# Patient Record
Sex: Female | Born: 1960 | Race: White | Hispanic: No | Marital: Single | Smoking: Current every day smoker
Health system: Southern US, Community
[De-identification: ages and names within clinical notes are randomized; demographics above are authoritative.]

## PROBLEM LIST (undated history)

## (undated) DIAGNOSIS — G4730 Sleep apnea, unspecified: Secondary | ICD-10-CM

## (undated) DIAGNOSIS — E119 Type 2 diabetes mellitus without complications: Secondary | ICD-10-CM

## (undated) DIAGNOSIS — K219 Gastro-esophageal reflux disease without esophagitis: Secondary | ICD-10-CM

## (undated) DIAGNOSIS — Z9889 Other specified postprocedural states: Secondary | ICD-10-CM

## (undated) DIAGNOSIS — I8290 Acute embolism and thrombosis of unspecified vein: Secondary | ICD-10-CM

## (undated) DIAGNOSIS — F32A Depression, unspecified: Secondary | ICD-10-CM

## (undated) DIAGNOSIS — M5430 Sciatica, unspecified side: Secondary | ICD-10-CM

## (undated) DIAGNOSIS — I10 Essential (primary) hypertension: Secondary | ICD-10-CM

## (undated) DIAGNOSIS — R112 Nausea with vomiting, unspecified: Secondary | ICD-10-CM

## (undated) DIAGNOSIS — F329 Major depressive disorder, single episode, unspecified: Secondary | ICD-10-CM

## (undated) HISTORY — PX: ROTATOR CUFF REPAIR: SHX139

---

## 1992-10-24 DIAGNOSIS — I8290 Acute embolism and thrombosis of unspecified vein: Secondary | ICD-10-CM

## 1992-10-24 HISTORY — DX: Acute embolism and thrombosis of unspecified vein: I82.90

## 1998-04-24 ENCOUNTER — Encounter: Admission: RE | Admit: 1998-04-24 | Discharge: 1998-07-23 | Payer: Self-pay | Admitting: Endocrinology

## 1998-05-04 ENCOUNTER — Ambulatory Visit (HOSPITAL_BASED_OUTPATIENT_CLINIC_OR_DEPARTMENT_OTHER): Admission: RE | Admit: 1998-05-04 | Discharge: 1998-05-04 | Payer: Self-pay | Admitting: Orthopedic Surgery

## 1998-10-19 ENCOUNTER — Emergency Department (HOSPITAL_COMMUNITY): Admission: EM | Admit: 1998-10-19 | Discharge: 1998-10-20 | Payer: Self-pay | Admitting: Emergency Medicine

## 1998-11-03 ENCOUNTER — Encounter: Admission: RE | Admit: 1998-11-03 | Discharge: 1999-02-01 | Payer: Self-pay | Admitting: Endocrinology

## 1998-12-14 ENCOUNTER — Emergency Department (HOSPITAL_COMMUNITY): Admission: EM | Admit: 1998-12-14 | Discharge: 1998-12-14 | Payer: Self-pay | Admitting: Emergency Medicine

## 1999-01-05 ENCOUNTER — Emergency Department (HOSPITAL_COMMUNITY): Admission: EM | Admit: 1999-01-05 | Discharge: 1999-01-05 | Payer: Self-pay | Admitting: Emergency Medicine

## 1999-02-04 ENCOUNTER — Encounter: Admission: RE | Admit: 1999-02-04 | Discharge: 1999-05-05 | Payer: Self-pay | Admitting: Endocrinology

## 1999-05-10 ENCOUNTER — Encounter: Admission: RE | Admit: 1999-05-10 | Discharge: 1999-08-08 | Payer: Self-pay | Admitting: Endocrinology

## 2001-02-05 ENCOUNTER — Inpatient Hospital Stay (HOSPITAL_COMMUNITY): Admission: EM | Admit: 2001-02-05 | Discharge: 2001-02-10 | Payer: Self-pay | Admitting: Endocrinology

## 2001-05-29 ENCOUNTER — Emergency Department (HOSPITAL_COMMUNITY): Admission: EM | Admit: 2001-05-29 | Discharge: 2001-05-29 | Payer: Self-pay | Admitting: *Deleted

## 2002-04-17 ENCOUNTER — Ambulatory Visit (HOSPITAL_BASED_OUTPATIENT_CLINIC_OR_DEPARTMENT_OTHER): Admission: RE | Admit: 2002-04-17 | Discharge: 2002-04-17 | Payer: Self-pay | Admitting: Orthopedic Surgery

## 2003-09-20 ENCOUNTER — Emergency Department (HOSPITAL_COMMUNITY): Admission: EM | Admit: 2003-09-20 | Discharge: 2003-09-20 | Payer: Self-pay | Admitting: Emergency Medicine

## 2004-12-04 ENCOUNTER — Emergency Department (HOSPITAL_COMMUNITY): Admission: EM | Admit: 2004-12-04 | Discharge: 2004-12-04 | Payer: Self-pay | Admitting: *Deleted

## 2006-07-07 ENCOUNTER — Emergency Department (HOSPITAL_COMMUNITY): Admission: EM | Admit: 2006-07-07 | Discharge: 2006-07-07 | Payer: Self-pay | Admitting: Emergency Medicine

## 2007-04-09 ENCOUNTER — Ambulatory Visit: Payer: Self-pay | Admitting: *Deleted

## 2007-04-09 ENCOUNTER — Ambulatory Visit: Payer: Self-pay | Admitting: Family Medicine

## 2007-04-12 ENCOUNTER — Ambulatory Visit (HOSPITAL_COMMUNITY): Admission: RE | Admit: 2007-04-12 | Discharge: 2007-04-12 | Payer: Self-pay | Admitting: Obstetrics and Gynecology

## 2007-04-16 ENCOUNTER — Encounter: Admission: RE | Admit: 2007-04-16 | Discharge: 2007-04-16 | Payer: Self-pay | Admitting: Family Medicine

## 2007-05-21 ENCOUNTER — Ambulatory Visit: Payer: Self-pay | Admitting: Family Medicine

## 2007-05-21 LAB — CONVERTED CEMR LAB
ALT: 8 units/L (ref 0–35)
AST: 11 units/L (ref 0–37)
Albumin: 4.4 g/dL (ref 3.5–5.2)
Alkaline Phosphatase: 105 units/L (ref 39–117)
BUN: 27 mg/dL — ABNORMAL HIGH (ref 6–23)
Basophils Absolute: 0.1 10*3/uL (ref 0.0–0.1)
Basophils Relative: 1 % (ref 0–1)
CO2: 22 meq/L (ref 19–32)
Calcium: 9.4 mg/dL (ref 8.4–10.5)
Chloride: 102 meq/L (ref 96–112)
Cholesterol: 212 mg/dL — ABNORMAL HIGH (ref 0–200)
Creatinine, Ser: 0.97 mg/dL (ref 0.40–1.20)
Eosinophils Absolute: 0.2 10*3/uL (ref 0.0–0.7)
Eosinophils Relative: 3 % (ref 0–5)
Glucose, Bld: 268 mg/dL — ABNORMAL HIGH (ref 70–99)
HCT: 41.1 % (ref 36.0–46.0)
HDL: 57 mg/dL (ref >39)
Hemoglobin: 13.2 g/dL (ref 12.0–15.0)
LDL Cholesterol: 138 mg/dL — ABNORMAL HIGH (ref 0–99)
Lymphocytes Relative: 28 % (ref 12–46)
Lymphs Abs: 2.4 10*3/uL (ref 0.7–3.3)
MCHC: 32.1 g/dL (ref 30.0–36.0)
MCV: 90.9 fL (ref 78.0–100.0)
Monocytes Absolute: 0.4 10*3/uL (ref 0.2–0.7)
Monocytes Relative: 5 % (ref 3–11)
Neutro Abs: 5.5 10*3/uL (ref 1.7–7.7)
Neutrophils Relative %: 64 % (ref 43–77)
Platelets: 285 10*3/uL (ref 150–400)
Potassium: 4.8 meq/L (ref 3.5–5.3)
RBC: 4.52 M/uL (ref 3.87–5.11)
RDW: 14.6 % — ABNORMAL HIGH (ref 11.5–14.0)
Sodium: 136 meq/L (ref 135–145)
TSH: 3.44 microintl units/mL (ref 0.350–5.50)
Total Bilirubin: 0.3 mg/dL (ref 0.3–1.2)
Total CHOL/HDL Ratio: 3.7
Total Protein: 7.3 g/dL (ref 6.0–8.3)
Triglycerides: 87 mg/dL (ref <150)
VLDL: 17 mg/dL (ref 0–40)
WBC: 8.6 10*3/uL (ref 4.0–10.5)

## 2007-06-26 ENCOUNTER — Ambulatory Visit: Payer: Self-pay | Admitting: Family Medicine

## 2007-08-13 ENCOUNTER — Ambulatory Visit: Payer: Self-pay | Admitting: Family Medicine

## 2007-08-13 LAB — CONVERTED CEMR LAB
Cholesterol: 240 mg/dL — ABNORMAL HIGH (ref 0–200)
HDL: 51 mg/dL (ref >39)
LDL Cholesterol: 162 mg/dL — ABNORMAL HIGH (ref 0–99)
Total CHOL/HDL Ratio: 4.7
Triglycerides: 134 mg/dL (ref <150)
VLDL: 27 mg/dL (ref 0–40)

## 2007-09-24 ENCOUNTER — Encounter: Admission: RE | Admit: 2007-09-24 | Discharge: 2007-09-24 | Payer: Self-pay | Admitting: Family Medicine

## 2007-10-30 ENCOUNTER — Ambulatory Visit: Payer: Self-pay | Admitting: Family Medicine

## 2007-10-30 LAB — CONVERTED CEMR LAB
ALT: 8 units/L (ref 0–35)
Cholesterol: 177 mg/dL (ref 0–200)
HDL: 47 mg/dL (ref >39)
LDL Cholesterol: 101 mg/dL — ABNORMAL HIGH (ref 0–99)
Microalb, Ur: 0.4 mg/dL (ref 0.00–1.89)
Total CHOL/HDL Ratio: 3.8
Triglycerides: 146 mg/dL (ref <150)
VLDL: 29 mg/dL (ref 0–40)

## 2008-02-29 ENCOUNTER — Ambulatory Visit: Payer: Self-pay | Admitting: Internal Medicine

## 2008-03-18 ENCOUNTER — Ambulatory Visit: Payer: Self-pay | Admitting: Family Medicine

## 2008-03-18 ENCOUNTER — Encounter (INDEPENDENT_AMBULATORY_CARE_PROVIDER_SITE_OTHER): Payer: Self-pay | Admitting: Family Medicine

## 2008-04-15 ENCOUNTER — Encounter: Admission: RE | Admit: 2008-04-15 | Discharge: 2008-04-15 | Payer: Self-pay | Admitting: Family Medicine

## 2008-04-15 ENCOUNTER — Ambulatory Visit: Payer: Self-pay | Admitting: Family Medicine

## 2008-04-15 LAB — CONVERTED CEMR LAB
BUN: 26 mg/dL — ABNORMAL HIGH (ref 6–23)
CO2: 22 meq/L (ref 19–32)
Calcium: 9 mg/dL (ref 8.4–10.5)
Chloride: 108 meq/L (ref 96–112)
Creatinine, Ser: 1.07 mg/dL (ref 0.40–1.20)
Glucose, Bld: 234 mg/dL — ABNORMAL HIGH (ref 70–99)
Potassium: 5 meq/L (ref 3.5–5.3)
Sodium: 141 meq/L (ref 135–145)

## 2008-04-19 ENCOUNTER — Emergency Department (HOSPITAL_COMMUNITY): Admission: EM | Admit: 2008-04-19 | Discharge: 2008-04-19 | Payer: Self-pay | Admitting: Emergency Medicine

## 2008-06-27 ENCOUNTER — Emergency Department (HOSPITAL_COMMUNITY): Admission: EM | Admit: 2008-06-27 | Discharge: 2008-06-27 | Payer: Self-pay | Admitting: Family Medicine

## 2009-01-02 ENCOUNTER — Ambulatory Visit: Payer: Self-pay | Admitting: Family Medicine

## 2009-01-02 LAB — CONVERTED CEMR LAB
ALT: 10 units/L (ref 0–35)
AST: 13 units/L (ref 0–37)
Albumin: 4.2 g/dL (ref 3.5–5.2)
Alkaline Phosphatase: 94 units/L (ref 39–117)
BUN: 24 mg/dL — ABNORMAL HIGH (ref 6–23)
CO2: 20 meq/L (ref 19–32)
Calcium: 8.8 mg/dL (ref 8.4–10.5)
Chloride: 105 meq/L (ref 96–112)
Cholesterol: 142 mg/dL (ref 0–200)
Creatinine, Ser: 0.94 mg/dL (ref 0.40–1.20)
Glucose, Bld: 281 mg/dL — ABNORMAL HIGH (ref 70–99)
HDL: 54 mg/dL (ref >39)
LDL Cholesterol: 77 mg/dL (ref 0–99)
Microalb, Ur: 1.43 mg/dL (ref 0.00–1.89)
Potassium: 4.9 meq/L (ref 3.5–5.3)
Sodium: 139 meq/L (ref 135–145)
Total Bilirubin: 0.4 mg/dL (ref 0.3–1.2)
Total CHOL/HDL Ratio: 2.6
Total Protein: 6.8 g/dL (ref 6.0–8.3)
Triglycerides: 54 mg/dL (ref <150)
VLDL: 11 mg/dL (ref 0–40)

## 2009-01-08 ENCOUNTER — Ambulatory Visit: Payer: Self-pay | Admitting: Internal Medicine

## 2009-05-06 IMAGING — CR DG HAND COMPLETE 3+V*L*
3 series · 3 of 3 positions shown · non-contrast
Comparison: None

CLINICAL DATA: Left hand injury, pain fifth MCP and first and
second meta carpal

LEFT HAND - COMPLETE 3+ VIEW

[view not recorded (1 of 3)]
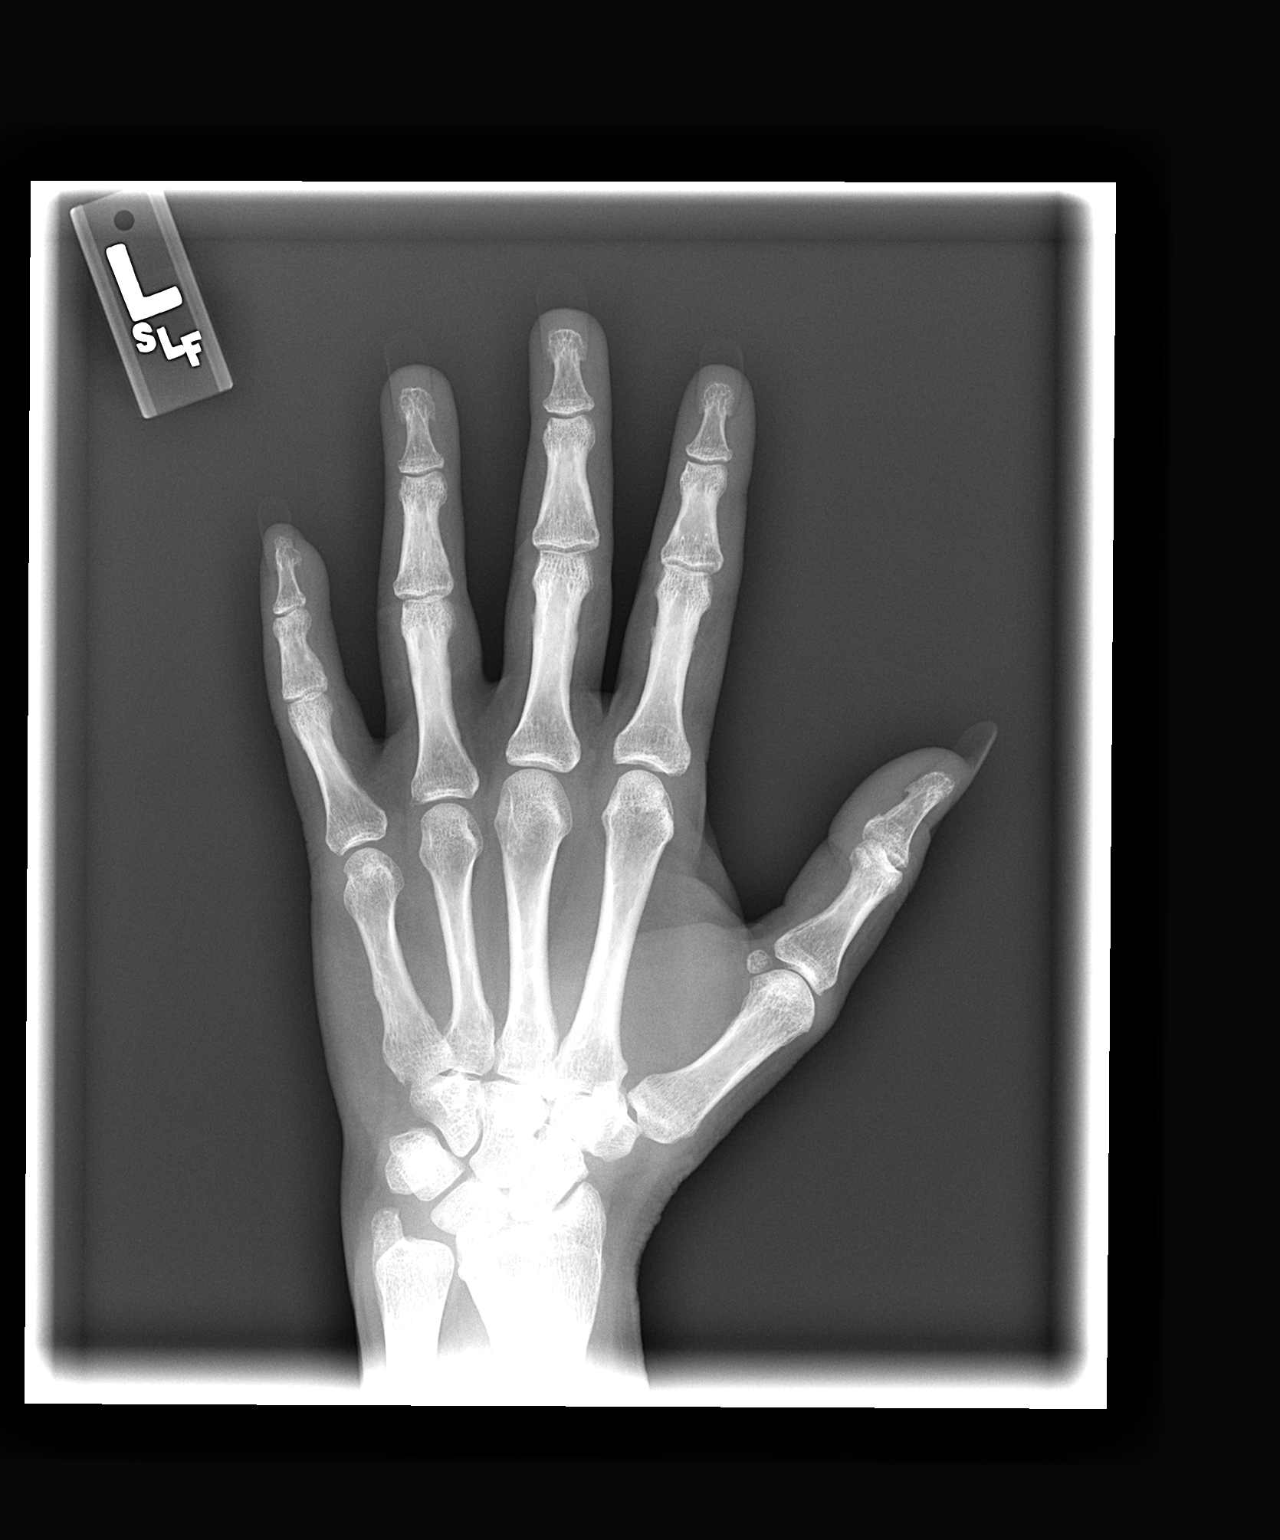

[view not recorded (2 of 3)]
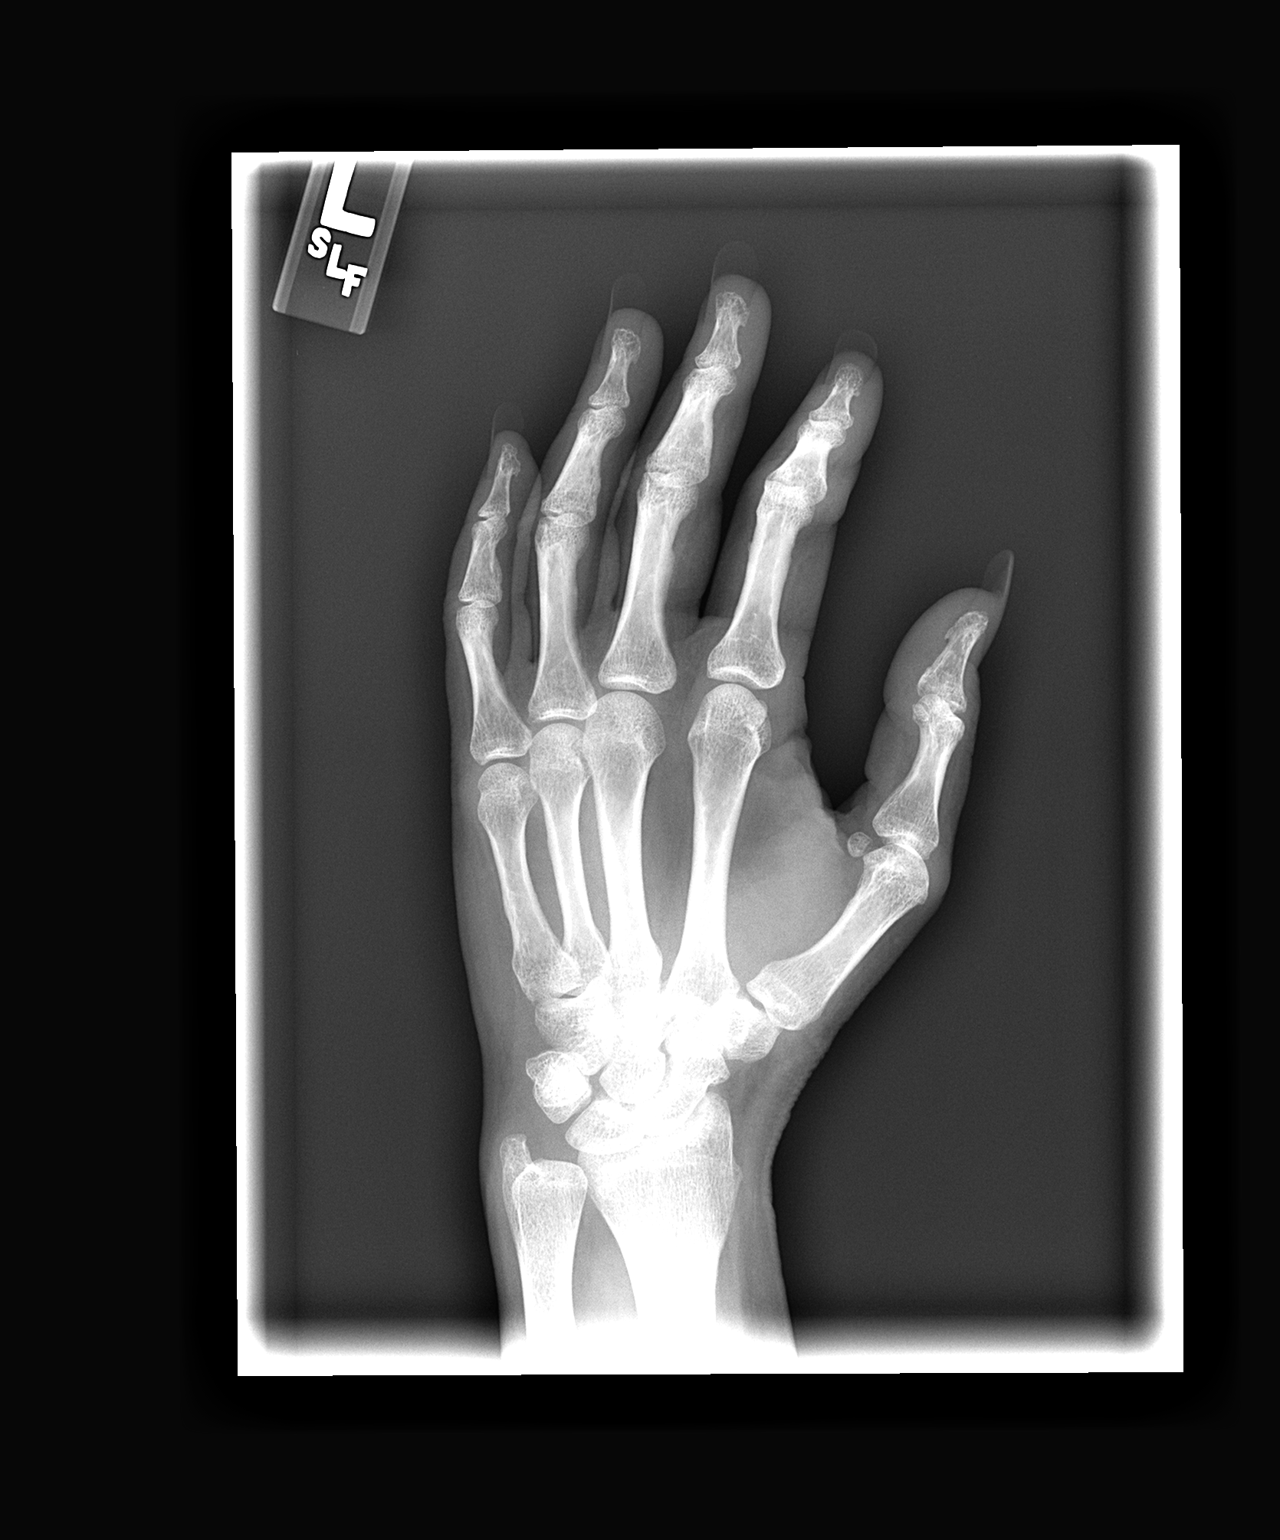

[view not recorded (3 of 3)]
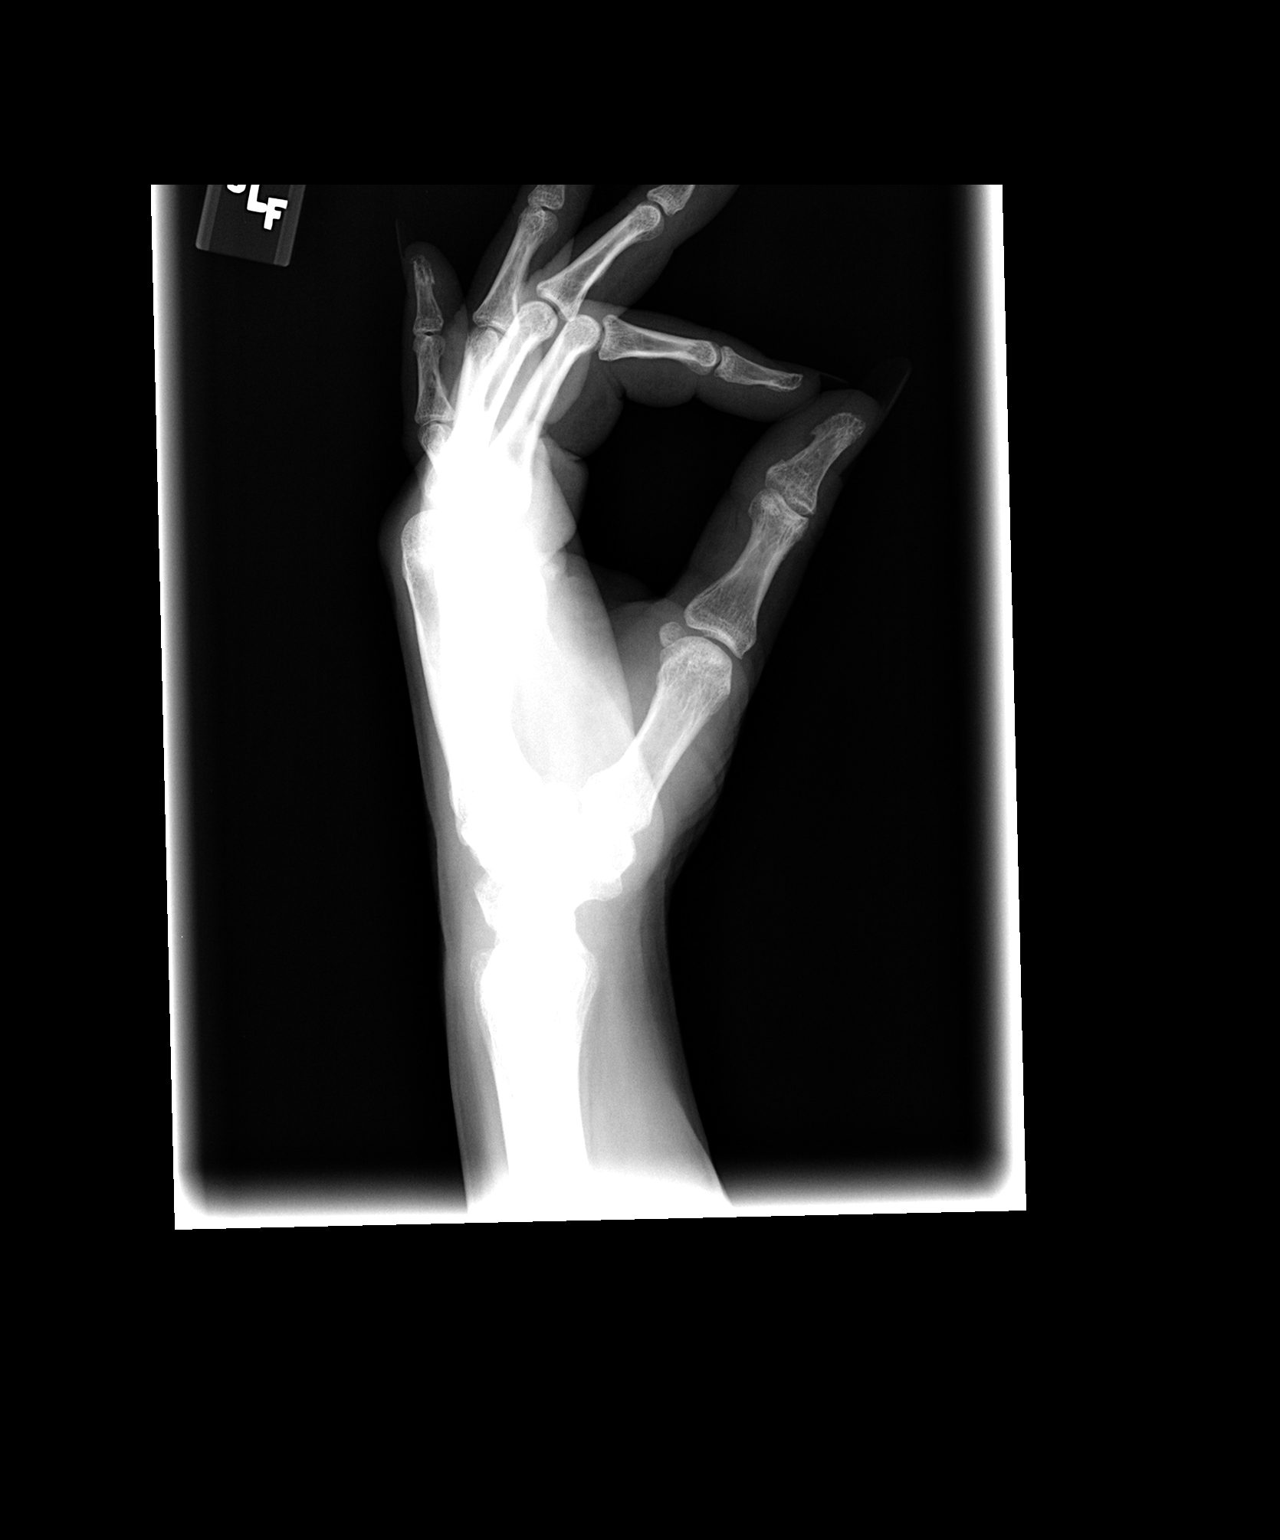

[3 of 3 positions shown; findings below may reference images not displayed]

FINDINGS: No fracture or dislocation.  No soft tissue abnormality.
IMPRESSION: 1..  No evidence of fracture dislocation of the left hand.

## 2009-05-14 ENCOUNTER — Encounter: Admission: RE | Admit: 2009-05-14 | Discharge: 2009-05-14 | Payer: Self-pay | Admitting: Family Medicine

## 2009-05-28 ENCOUNTER — Ambulatory Visit: Payer: Self-pay | Admitting: Family Medicine

## 2009-07-09 ENCOUNTER — Ambulatory Visit: Payer: Self-pay | Admitting: Family Medicine

## 2009-08-27 ENCOUNTER — Ambulatory Visit: Payer: Self-pay | Admitting: Family Medicine

## 2009-08-28 ENCOUNTER — Encounter (INDEPENDENT_AMBULATORY_CARE_PROVIDER_SITE_OTHER): Payer: Self-pay | Admitting: Family Medicine

## 2009-08-28 LAB — CONVERTED CEMR LAB
BUN: 22 mg/dL (ref 6–23)
CO2: 21 meq/L (ref 19–32)
Calcium: 9 mg/dL (ref 8.4–10.5)
Chloride: 103 meq/L (ref 96–112)
Creatinine, Ser: 0.91 mg/dL (ref 0.40–1.20)
Glucose, Bld: 271 mg/dL — ABNORMAL HIGH (ref 70–99)
Potassium: 4.8 meq/L (ref 3.5–5.3)
Sodium: 136 meq/L (ref 135–145)
TSH: 2.774 microintl units/mL (ref 0.350–4.500)
Vit D, 25-Hydroxy: 19 ng/mL — ABNORMAL LOW (ref 30–89)

## 2009-09-29 ENCOUNTER — Ambulatory Visit: Payer: Self-pay | Admitting: Family Medicine

## 2009-12-10 ENCOUNTER — Ambulatory Visit: Payer: Self-pay | Admitting: Family Medicine

## 2009-12-10 LAB — CONVERTED CEMR LAB
BUN: 18 mg/dL (ref 6–23)
CO2: 23 meq/L (ref 19–32)
Calcium: 9.3 mg/dL (ref 8.4–10.5)
Chloride: 101 meq/L (ref 96–112)
Cholesterol: 160 mg/dL (ref 0–200)
Creatinine, Ser: 0.94 mg/dL (ref 0.40–1.20)
Glucose, Bld: 168 mg/dL — ABNORMAL HIGH (ref 70–99)
HDL: 54 mg/dL (ref >39)
LDL Cholesterol: 86 mg/dL (ref 0–99)
Potassium: 4.3 meq/L (ref 3.5–5.3)
Sodium: 137 meq/L (ref 135–145)
Total CHOL/HDL Ratio: 3
Triglycerides: 98 mg/dL (ref <150)
VLDL: 20 mg/dL (ref 0–40)
Vit D, 25-Hydroxy: 30 ng/mL (ref 30–89)

## 2010-02-10 ENCOUNTER — Ambulatory Visit: Payer: Self-pay | Admitting: Internal Medicine

## 2010-02-23 ENCOUNTER — Ambulatory Visit: Payer: Self-pay | Admitting: Family Medicine

## 2010-02-23 LAB — CONVERTED CEMR LAB: Microalb, Ur: 0.5 mg/dL (ref 0.00–1.89)

## 2010-05-02 ENCOUNTER — Emergency Department (HOSPITAL_COMMUNITY): Admission: EM | Admit: 2010-05-02 | Discharge: 2010-05-02 | Payer: Self-pay | Admitting: Emergency Medicine

## 2010-07-02 ENCOUNTER — Ambulatory Visit: Payer: Self-pay | Admitting: Family Medicine

## 2010-08-05 ENCOUNTER — Encounter: Admission: RE | Admit: 2010-08-05 | Discharge: 2010-08-05 | Payer: Self-pay | Admitting: Family Medicine

## 2010-11-18 ENCOUNTER — Encounter (INDEPENDENT_AMBULATORY_CARE_PROVIDER_SITE_OTHER): Payer: Self-pay | Admitting: Family Medicine

## 2010-11-18 LAB — CONVERTED CEMR LAB
ALT: 9 units/L (ref 0–35)
AST: 12 units/L (ref 0–37)
Albumin: 4.5 g/dL (ref 3.5–5.2)
Alkaline Phosphatase: 93 units/L (ref 39–117)
BUN: 25 mg/dL — ABNORMAL HIGH (ref 6–23)
Basophils Absolute: 0.1 10*3/uL (ref 0.0–0.1)
Basophils Relative: 1 % (ref 0–1)
CO2: 25 meq/L (ref 19–32)
Calcium: 9.9 mg/dL (ref 8.4–10.5)
Chloride: 102 meq/L (ref 96–112)
Cholesterol: 149 mg/dL (ref 0–200)
Creatinine, Ser: 1.08 mg/dL (ref 0.40–1.20)
Eosinophils Absolute: 0.2 10*3/uL (ref 0.0–0.7)
Eosinophils Relative: 1 % (ref 0–5)
Glucose, Bld: 157 mg/dL — ABNORMAL HIGH (ref 70–99)
HCT: 42.2 % (ref 36.0–46.0)
HDL: 47 mg/dL (ref >39)
Hemoglobin: 13.5 g/dL (ref 12.0–15.0)
LDL Cholesterol: 79 mg/dL (ref 0–99)
Lymphocytes Relative: 18 % (ref 12–46)
Lymphs Abs: 2.3 10*3/uL (ref 0.7–4.0)
MCHC: 32 g/dL (ref 30.0–36.0)
MCV: 87.2 fL (ref 78.0–100.0)
Monocytes Absolute: 0.8 10*3/uL (ref 0.1–1.0)
Monocytes Relative: 7 % (ref 3–12)
Neutro Abs: 9.6 10*3/uL — ABNORMAL HIGH (ref 1.7–7.7)
Neutrophils Relative %: 74 % (ref 43–77)
Platelets: 286 10*3/uL (ref 150–400)
Potassium: 4.5 meq/L (ref 3.5–5.3)
RBC: 4.84 M/uL (ref 3.87–5.11)
RDW: 14.1 % (ref 11.5–15.5)
Sodium: 136 meq/L (ref 135–145)
TSH: 2.794 microintl units/mL (ref 0.350–4.500)
Total Bilirubin: 0.6 mg/dL (ref 0.3–1.2)
Total CHOL/HDL Ratio: 3.2
Total Protein: 7.2 g/dL (ref 6.0–8.3)
Triglycerides: 117 mg/dL (ref <150)
VLDL: 23 mg/dL (ref 0–40)
WBC: 12.9 10*3/uL — ABNORMAL HIGH (ref 4.0–10.5)

## 2010-12-15 ENCOUNTER — Emergency Department (HOSPITAL_COMMUNITY)
Admission: EM | Admit: 2010-12-15 | Discharge: 2010-12-15 | Disposition: A | Discharge status: Home / Self Care | Payer: Self-pay | Attending: Emergency Medicine | Admitting: Emergency Medicine

## 2010-12-15 DIAGNOSIS — E7800 Pure hypercholesterolemia, unspecified: Secondary | ICD-10-CM | POA: Insufficient documentation

## 2010-12-15 DIAGNOSIS — I10 Essential (primary) hypertension: Secondary | ICD-10-CM | POA: Insufficient documentation

## 2010-12-15 DIAGNOSIS — Z794 Long term (current) use of insulin: Secondary | ICD-10-CM | POA: Insufficient documentation

## 2010-12-15 DIAGNOSIS — R112 Nausea with vomiting, unspecified: Secondary | ICD-10-CM | POA: Insufficient documentation

## 2010-12-15 DIAGNOSIS — R55 Syncope and collapse: Secondary | ICD-10-CM | POA: Insufficient documentation

## 2010-12-15 DIAGNOSIS — R42 Dizziness and giddiness: Secondary | ICD-10-CM | POA: Insufficient documentation

## 2010-12-15 DIAGNOSIS — E119 Type 2 diabetes mellitus without complications: Secondary | ICD-10-CM | POA: Insufficient documentation

## 2010-12-15 LAB — POCT I-STAT, CHEM 8
BUN: 20 mg/dL (ref 6–23)
Calcium, Ion: 1.2 mmol/L (ref 1.12–1.32)
Chloride: 105 mEq/L (ref 96–112)
Creatinine, Ser: 1 mg/dL (ref 0.4–1.2)
Glucose, Bld: 256 mg/dL — ABNORMAL HIGH (ref 70–99)
HCT: 42 % (ref 36.0–46.0)
Hemoglobin: 14.3 g/dL (ref 12.0–15.0)
Potassium: 3.9 mEq/L (ref 3.5–5.1)
Sodium: 136 mEq/L (ref 135–145)
TCO2: 22 mmol/L (ref 0–100)

## 2010-12-15 LAB — GLUCOSE, CAPILLARY
Glucose-Capillary: 253 mg/dL — ABNORMAL HIGH (ref 70–99)
Glucose-Capillary: 157 mg/dL — ABNORMAL HIGH (ref 70–99)

## 2011-01-09 LAB — GLUCOSE, CAPILLARY: Glucose-Capillary: 257 mg/dL — ABNORMAL HIGH (ref 70–99)

## 2011-03-11 NOTE — H&P (Signed)
[facility]  Patient:    CAMPBELL, AGRAMONTE                         MRN: [id number] Adm. Date:  02/05/01 Attending:  Bernadene Person, M.D. Dictator:   Lillia Carmel. Barefoot, P.A.                         History and Physical  DATE OF BIRTH:  June 28, 1961.  CHIEF COMPLAINT:  Nausea, vomiting, short of breath.  HISTORY OF PRESENT ILLNESS:  50 year old white female patient complaining of persistent nausea and vomiting beginning on Saturday. She had been doing fairly well, although, had stopped many of her medicines and was only taking her insulin. Prior to that time she had also been on Glucophage, Altace, Macrodantin and Paxil. She describes nausea and vomiting beginning on Saturday. She called and was given a Phenergan suppository which was not beneficial. She describes becoming increasingly weak with a persistent nausea and vomiting. She has also become increasingly confused and is somewhat short of breath today. Her mother accompanies her now in the office and patient is in a wheelchair. Her oxygen level is 99% saturation on room air and her temperature is approximately 91.7, pulse was 120 and respirations 26. Laboratory tests were significant with a white blood cell count of 26.3, blood sugar of 632 with carbon dioxide decreased at 6.0. Her potassium was normal but BUN and creatinine were elevated at 44.0 and 2.0. The patient has been admitted for treatment of diabetic ketoacidosis.  ALLERGIES:  DARVOCET -- nausea.  CURRENT MEDICATIONS:   Novolin 70/30, 13 units b.i.d.  PAST MEDICAL HISTORY: 1. Insulin dependent diabetes mellitus. 2. Recurrent urinary tract infections. 3. Chronic smoker. 4. Proteinuria. 5. Depression. 6. Left rotator cuff repair.  FAMILY HISTORY:  Mother is alive with osteoarthritis, father is alive with hypertension and a history of lung cancer. Her maternal grandmother did have diabetes.  SOCIAL HISTORY:  She is  divorced and has no children. She denies any alcohol use. She is a smoker and currently works at [employer].  REVIEW OF SYSTEMS:  Positive for nausea, vomiting, weakness, lethargy, increased confusion, shortness of breath and a mild diffuse abdominal pain. She denies any fever, chills, cold symptoms, headache, chest pain, change in bowel habits, melena, hematochezia or peripheral edema.  PHYSICAL EXAMINATION:  VITAL SIGNS:  Temperature 91.7, pulse 120, respirations 26 and blood pressure 120/72. A weight not obtained, in wheelchair. O2 saturation on room air is 99%.  GENERAL:  Ill-appearing, thin, white female who appears lethargic and drowsy with slightly decreased mentation. Her mother does answer help her with questions. She currently is having difficulty with a history.  HEENT:  Normocephalic and atraumatic.  Oropharynx does appear very dry. Pupils equal, round and reactive to light.  NECK:  Supple without thyromegaly or lymphadenopathy or bruit.  LUNGS:  Clear to auscultation bilaterally.  CARDIOVASCULAR:  Tachycardic, no murmurs, rubs, or gallops.  ABDOMEN:  Exam performed seated in wheelchair. She is diffusely but nonfocally tender. There is no mass or organomegaly palpated.  GENITOURINARY:  Deferred.  EXTREMITIES:  Without cyanosis, clubbing, or edema.  SKIN:  Decreased turgor, appears very dry without evidence of a rash.  NEUROLOGIC:  Her speech is somewhat slow and slurred. Gait is not tested. She appears to have decreased strength in lower extremities against some resistance.  LABORATORY AND ACCESSORY DATA:  White blood  cell count elevated at 26.3, hemoglobin and hematocrit elevated at 15.1 and 47.2 respectively. Glucose 632. BUN and creatinine elevated at 44.0 and 2.0 respectively, sodium 131, carbon dioxide of 6.0.  ASSESSMENT: 1. Diabetic ketoacidosis. 2. Dehydration. 3. History of insulin dependent diabetes mellitus. 4. Chronic smoker. 5. History  of recurrent urinary tract infections. 6. History of proteinuria. 7. History of depression. 8. Status post left rotator cuff repair. 9. Intolerance of DARVOCET, nausea.  PLAN: 1. Will admit to Dr. Juleen China. 2. Will place on cardiac monitor and begin insulin drip as    described in orders. 3. Will get a STAT blood gas on room air, then begin nasal cannula    at 2 L per minute. 4. Will closely monitor instructed and outs q. shift as well as    routine vital signs. 5. Will get STAT electrolytes and repeat electrolytes every 4 hours    and ask that any abnormal labs be called into physician on call. 6. Will also begin IV fluids, give Phenergan IM as needed for    nausea. 7. Will begin clear liquid diet and check serum magnesium level as    well as urinalysis. 8. Will also ask that DTCA see patient while in hospital. DD:  02/05/01 TD:  02/05/01 Job: [id number]

## 2011-03-11 NOTE — Discharge Summary (Signed)
[facility]  Patient:    Carrie Mcpherson, Carrie Mcpherson                         MRN: [id number] Adm. Date:  02/05/01 Disc. Date: 02/10/01 Attending:  Bernadene Person, M.D.                           Discharge Summary  DISCHARGE DIAGNOSES: 1. Diabetic ketoacidosis. 2. Tobacco abuse.  HISTORY OF PRESENT ILLNESS:  Patient is a 50 year old female patient of Dr. Otilio Carpen admitted from our office after she presented in DKA.  The patient was placed on insulin protocol and electrolyte replacement for her DKA.  She progressively improved.  She is discharged on April 20 in improved condition on insulin therapy only and she is to be followed up in my office in one weeks time.  DISCHARGE MEDICATIONS: 1. 70/30 insulin 30 units in the morning, 24 units at supper, along with a    sliding scale Humalog insulin 6 units for sugars greater than 300. 2. Nexium 40 mg daily.  LABORATORY DATA:  On April 15, blood gas had a pH of 7.017, PO2 10.6, bicarbonate 2.7.  CBC:  White count 11,700.  Electrolytes:  Sodium was 128, potassium 5.2, CO2 5.  Blood cultures had no growth.  CONDITION ON DISCHARGE:  Improved. DD:  03/09/01 TD:  03/09/01 Job: [id number]

## 2011-03-11 NOTE — Op Note (Signed)
[place]  Patient:    Carrie Mcpherson, Carrie Mcpherson Visit Number: [id number] MRN: [id number]          Service Type: DSU Location: [facility] Attending Physician:  Nadara Mustard Dictated by:   Nadara Mustard, M.D. Proc. Date: 04/17/02 Admit Date:  04/17/2002 Discharge Date: 04/17/2002                             Operative Report  PREOPERATIVE DIAGNOSIS: 1. 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 2. Partial rotator cuff tear, right shoulder. 3. Impingement syndrome.  POSTOPERATIVE DIAGNOSIS:  OPERATION PERFORMED: 1. Manipulation under anesthesia, right shoulder. 2. Right shoulder arthroscopy with debridement of partial rotator cuff tear. 2. Subacromial decompression.  SURGEON:  Nadara Mustard, M.D.  ASSISTANT:  ANESTHESIA:  General endotracheal plus interscalene block.  ESTIMATED BLOOD LOSS:  Minimal.  ANTIBIOTICS:  None.  DRAINS:  None.  COMPLICATIONS:  None.  DISPOSITION:  To PACU in stable condition.  INDICATIONS FOR PROCEDURE:  The patient is a 50 year old woman with diabetes who has developed a frozen right shoulder with pain with activities of daily living, weakness, has failed conservative care and presents at this time for arthroscopic intervention.  Risks and benefits were discussed including infection, neurovascular injury, persistent pain, need for additional surgery. The patient states that she understands and wishes to proceed at this time.  DESCRIPTION OF PROCEDURE:  The patient was brought to operating room 6 after undergoing interscalene block.  The patient then underwent general endotracheal anesthetic.  After an adequate level of anesthesia was obtained , the patient was placed in the beach chair position and her right upper extremity was prepped using DuraPrep and draped into a sterile field.  The scope was inserted in the posterior portal.  The anterior portal was established from the outside in technique with  an 18 gauge needle to localize the portal and the cannula was placed in the anterior portal and a lateral portal was also established.  Visualization of the shoulder joint after manipulation showed a significant amount of synovitis.  The shaver and vapor Mitek were used to debride and coagulate the synovitis.  There was a good attachment to the humeral head of the rotator cuff and there were no full thickness tears.  After debridement of partial tearing of the rotator cuff, debridement of a grade 1 SLAP lesion.  The instruments were removed.  The scope was then inserted into the subacromial space through the posterior portal and the instruments were then inserted from the lateral portal to the subacromial space.  The patient had a very tight hook type three acromion and the acromionizer was used to perform a subacromial decompression.  The vapor Mitek was used for hemostasis.  The patient had a good improvement of her subacromial space after debridement.  The instruments were removed.  The portals were  closed using 3-0 nylon.  The wounds were covered with Adaptic orthopedic sponges, ABD dressing and HypaFix tape.  The patient was placed in a sling, extubated and taken to PACU in stable condition. Dictated by:   Nadara Mustard, M.D. Attending Physician:  Nadara Mustard DD:  04/17/02 TD:  04/18/02 Job: [id number]

## 2012-07-26 ENCOUNTER — Other Ambulatory Visit: Payer: Self-pay | Admitting: Internal Medicine

## 2012-07-26 ENCOUNTER — Other Ambulatory Visit (HOSPITAL_COMMUNITY)
Admission: RE | Admit: 2012-07-26 | Discharge: 2012-07-26 | Disposition: A | Discharge status: Home / Self Care | Payer: Commercial Indemnity | Source: Ambulatory Visit | Attending: Internal Medicine | Admitting: Internal Medicine

## 2012-07-26 DIAGNOSIS — Z01419 Encounter for gynecological examination (general) (routine) without abnormal findings: Secondary | ICD-10-CM | POA: Insufficient documentation

## 2012-07-26 DIAGNOSIS — Z1151 Encounter for screening for human papillomavirus (HPV): Secondary | ICD-10-CM | POA: Insufficient documentation

## 2012-10-25 ENCOUNTER — Other Ambulatory Visit: Payer: Self-pay | Admitting: Internal Medicine

## 2012-10-25 DIAGNOSIS — Z1231 Encounter for screening mammogram for malignant neoplasm of breast: Secondary | ICD-10-CM

## 2012-10-30 ENCOUNTER — Ambulatory Visit
Admission: RE | Admit: 2012-10-30 | Discharge: 2012-10-30 | Disposition: A | Discharge status: Home / Self Care | Payer: Commercial Indemnity | Source: Ambulatory Visit | Attending: Internal Medicine | Admitting: Internal Medicine

## 2012-10-30 DIAGNOSIS — Z1231 Encounter for screening mammogram for malignant neoplasm of breast: Secondary | ICD-10-CM

## 2013-06-24 DIAGNOSIS — G4730 Sleep apnea, unspecified: Secondary | ICD-10-CM

## 2013-06-24 HISTORY — DX: Sleep apnea, unspecified: G47.30

## 2013-10-25 ENCOUNTER — Other Ambulatory Visit: Payer: Self-pay

## 2013-10-25 DIAGNOSIS — Z1231 Encounter for screening mammogram for malignant neoplasm of breast: Secondary | ICD-10-CM

## 2013-11-18 ENCOUNTER — Ambulatory Visit
Admission: RE | Admit: 2013-11-18 | Discharge: 2013-11-18 | Disposition: A | Discharge status: Home / Self Care | Payer: Commercial Indemnity | Source: Ambulatory Visit

## 2013-11-18 DIAGNOSIS — Z1231 Encounter for screening mammogram for malignant neoplasm of breast: Secondary | ICD-10-CM

## 2014-04-28 ENCOUNTER — Other Ambulatory Visit: Payer: Self-pay | Admitting: Gastroenterology

## 2014-07-29 ENCOUNTER — Encounter (HOSPITAL_COMMUNITY): Payer: Self-pay | Admitting: Pharmacy Technician

## 2014-07-29 ENCOUNTER — Encounter (HOSPITAL_COMMUNITY): Payer: Self-pay | Admitting: *Deleted

## 2014-07-30 ENCOUNTER — Encounter (HOSPITAL_COMMUNITY): Payer: Self-pay | Admitting: Emergency Medicine

## 2014-07-30 ENCOUNTER — Emergency Department (HOSPITAL_COMMUNITY)
Admission: EM | Admit: 2014-07-30 | Discharge: 2014-07-30 | Disposition: A | Discharge status: Home / Self Care | Payer: Managed Care, Other (non HMO) | Attending: Emergency Medicine | Admitting: Emergency Medicine

## 2014-07-30 DIAGNOSIS — E119 Type 2 diabetes mellitus without complications: Secondary | ICD-10-CM | POA: Insufficient documentation

## 2014-07-30 DIAGNOSIS — F329 Major depressive disorder, single episode, unspecified: Secondary | ICD-10-CM | POA: Insufficient documentation

## 2014-07-30 DIAGNOSIS — Z720 Tobacco use: Secondary | ICD-10-CM | POA: Insufficient documentation

## 2014-07-30 DIAGNOSIS — K219 Gastro-esophageal reflux disease without esophagitis: Secondary | ICD-10-CM | POA: Insufficient documentation

## 2014-07-30 DIAGNOSIS — Z8739 Personal history of other diseases of the musculoskeletal system and connective tissue: Secondary | ICD-10-CM | POA: Insufficient documentation

## 2014-07-30 DIAGNOSIS — Z79899 Other long term (current) drug therapy: Secondary | ICD-10-CM | POA: Diagnosis not present

## 2014-07-30 DIAGNOSIS — Z794 Long term (current) use of insulin: Secondary | ICD-10-CM | POA: Diagnosis not present

## 2014-07-30 DIAGNOSIS — R739 Hyperglycemia, unspecified: Secondary | ICD-10-CM

## 2014-07-30 DIAGNOSIS — I10 Essential (primary) hypertension: Secondary | ICD-10-CM | POA: Insufficient documentation

## 2014-07-30 DIAGNOSIS — Z7982 Long term (current) use of aspirin: Secondary | ICD-10-CM | POA: Diagnosis not present

## 2014-07-30 LAB — COMPREHENSIVE METABOLIC PANEL
ALT: 12 U/L (ref 0–35)
AST: 11 U/L (ref 0–37)
Albumin: 3.8 g/dL (ref 3.5–5.2)
Alkaline Phosphatase: 121 U/L — ABNORMAL HIGH (ref 39–117)
Anion gap: 14 (ref 5–15)
BUN: 17 mg/dL (ref 6–23)
CO2: 22 mEq/L (ref 19–32)
Calcium: 9.4 mg/dL (ref 8.4–10.5)
Chloride: 95 mEq/L — ABNORMAL LOW (ref 96–112)
Creatinine, Ser: 0.72 mg/dL (ref 0.50–1.10)
GFR calc Af Amer: >90 mL/min (ref >90)
GFR calc non Af Amer: >90 mL/min (ref >90)
Glucose, Bld: 509 mg/dL — ABNORMAL HIGH (ref 70–99)
Potassium: 3.9 mEq/L (ref 3.7–5.3)
Sodium: 131 mEq/L — ABNORMAL LOW (ref 137–147)
Total Bilirubin: 0.3 mg/dL (ref 0.3–1.2)
Total Protein: 7.2 g/dL (ref 6.0–8.3)

## 2014-07-30 LAB — BLOOD GAS, VENOUS
Acid-base deficit: 1.9 mmol/L (ref 0.0–2.0)
Bicarbonate: 22.2 mEq/L (ref 20.0–24.0)
FIO2: 0.21 %
O2 Saturation: 94 %
Patient temperature: 98.6
TCO2: 19.8 mmol/L (ref 0–100)
pCO2, Ven: 37.7 mmHg — ABNORMAL LOW (ref 45.0–50.0)
pH, Ven: 7.387 — ABNORMAL HIGH (ref 7.250–7.300)
pO2, Ven: 68.5 mmHg — ABNORMAL HIGH (ref 30.0–45.0)

## 2014-07-30 LAB — CBC WITH DIFFERENTIAL/PLATELET
Basophils Absolute: 0 10*3/uL (ref 0.0–0.1)
Basophils Relative: 0 % (ref 0–1)
Eosinophils Absolute: 0.2 10*3/uL (ref 0.0–0.7)
Eosinophils Relative: 2 % (ref 0–5)
HCT: 37.5 % (ref 36.0–46.0)
Hemoglobin: 12.5 g/dL (ref 12.0–15.0)
Lymphocytes Relative: 20 % (ref 12–46)
Lymphs Abs: 1.9 10*3/uL (ref 0.7–4.0)
MCH: 28.1 pg (ref 26.0–34.0)
MCHC: 33.3 g/dL (ref 30.0–36.0)
MCV: 84.3 fL (ref 78.0–100.0)
Monocytes Absolute: 0.6 10*3/uL (ref 0.1–1.0)
Monocytes Relative: 6 % (ref 3–12)
Neutro Abs: 6.9 10*3/uL (ref 1.7–7.7)
Neutrophils Relative %: 72 % (ref 43–77)
Platelets: 204 10*3/uL (ref 150–400)
RBC: 4.45 MIL/uL (ref 3.87–5.11)
RDW: 13.5 % (ref 11.5–15.5)
WBC: 9.7 10*3/uL (ref 4.0–10.5)

## 2014-07-30 LAB — CBG MONITORING, ED
Glucose-Capillary: 563 mg/dL (ref 70–99)
Glucose-Capillary: 396 mg/dL — ABNORMAL HIGH (ref 70–99)

## 2014-07-30 MED ORDER — INSULIN ASPART 100 UNIT/ML ~~LOC~~ SOLN
3.0000 [IU] | Freq: Once | SUBCUTANEOUS | Status: AC
  Administered 2014-07-30: 3 [IU] via INTRAVENOUS
  Filled 2014-07-30: qty 1

## 2014-07-30 MED ORDER — SODIUM CHLORIDE 0.9 % IV BOLUS (SEPSIS)
1000.0000 mL | Freq: Once | INTRAVENOUS | Status: AC
  Administered 2014-07-30: 1000 mL via INTRAVENOUS

## 2014-07-30 NOTE — ED Notes (Signed)
Pt took 10u insulin and 17u lantus at 1pm.

## 2014-07-30 NOTE — ED Notes (Signed)
Pt reports cbg 587 at home at 1215. Typically runs no more than 180. Pt sts she thinks this is because she didn't take her insulin until after she ate.Reports feeling "woozy", dizzy, queasy. Denies vomiting, abd pain.

## 2014-07-30 NOTE — ED Notes (Signed)
MD at bedside.  EDP HARRISON IN TO SEE THIS PT

## 2014-07-30 NOTE — ED Provider Notes (Signed)
CSN: [id number]     Arrival date & time 07/30/14  1349 History   First MD Initiated Contact with Patient 07/30/14 1451     Chief Complaint  Patient presents with  . Hyperglycemia     (Consider location/radiation/quality/duration/timing/severity/associated sxs/prior Treatment) Patient is a 53 y.o. female presenting with hyperglycemia. The history is provided by the patient.  Hyperglycemia Blood sugar level PTA:  500's Severity:  Mild Onset quality:  Sudden Duration:  4 hours Timing:  Constant Progression:  Unchanged Chronicity:  New Diabetes status:  Controlled with insulin Current diabetic therapy:  Lantus qhs, and novolog Time since last antidiabetic medication: 4. Context comment:  Forgot to take insulin w/ breakfast this morning Relieved by:  Nothing Ineffective treatments:  Insulin Associated symptoms: no abdominal pain, no chest pain, no dizziness, no dysuria, no fatigue, no fever, no nausea, no shortness of breath and no vomiting     Past Medical History  Diagnosis Date  . Hypertension   . Sleep apnea sept 2014    borderline, no cpap needed  . Diabetes mellitus without complication   . Depression   . GERD (gastroesophageal reflux disease)     occasional  . Sciatic leg pain     right leg  . Blood clot in vein 1994     superficial after right ankle fracture  . PONV (postoperative nausea and vomiting)     vomiting after one surgery x 1   Past Surgical History  Procedure Laterality Date  . Rotator cuff repair Bilateral right 2004, left 1999   No family history on file. History  Substance Use Topics  . Smoking status: Current Every Day Smoker -- 0.50 packs/day for 30 years    Types: Cigarettes  . Smokeless tobacco: Never Used  . Alcohol Use: Yes     Comment: occasional   OB History   Grav Para Term Preterm Abortions TAB SAB Ect Mult Living                 Review of Systems  Constitutional: Negative for fever and fatigue.  HENT: Negative for congestion  and drooling.   Eyes: Negative for pain.  Respiratory: Negative for cough and shortness of breath.   Cardiovascular: Negative for chest pain.  Gastrointestinal: Negative for nausea, vomiting, abdominal pain and diarrhea.  Genitourinary: Negative for dysuria and hematuria.  Musculoskeletal: Negative for back pain, gait problem and neck pain.  Skin: Negative for color change.  Neurological: Negative for dizziness and headaches.       Felt "woozy" earlier, but not anymore after drinking water.   Hematological: Negative for adenopathy.  Psychiatric/Behavioral: Negative for behavioral problems.  All other systems reviewed and are negative.     Allergies  Review of patient's allergies indicates no known allergies.  Home Medications   Prior to Admission medications   Medication Sig Start Date End Date Taking? Authorizing Provider  aspirin EC 81 MG tablet Take 81 mg by mouth daily.   Yes Historical Provider, MD  atorvastatin (LIPITOR) 10 MG tablet Take 10 mg by mouth every morning.   Yes Historical Provider, MD  insulin aspart (NOVOLOG) 100 UNIT/ML injection Inject 5-10 Units into the skin 3 (three) times daily before meals.   Yes Historical Provider, MD  insulin glargine (LANTUS) 100 UNIT/ML injection Inject 34 Units into the skin at bedtime.   Yes Historical Provider, MD  losartan (COZAAR) 25 MG tablet Take 25 mg by mouth every morning.   Yes Historical Provider, MD  ranitidine (ZANTAC)  150 MG tablet Take 150 mg by mouth 2 (two) times daily.   Yes Historical Provider, MD  sertraline (ZOLOFT) 100 MG tablet Take 100 mg by mouth every morning.   Yes Historical Provider, MD  triamterene-hydrochlorothiazide (MAXZIDE-25) 37.5-25 MG per tablet Take 0.5 tablets by mouth every morning.   Yes Historical Provider, MD   BP 144/77  Pulse 70  Temp(Src) 98.1 F (36.7 C) (Oral)  Resp 16  SpO2 97% Physical Exam  Nursing note and vitals reviewed. Constitutional: She is oriented to person, place, and  time. She appears well-developed and well-nourished.  HENT:  Head: Normocephalic.  Mouth/Throat: Oropharynx is clear and moist. No oropharyngeal exudate.  Eyes: Conjunctivae and EOM are normal. Pupils are equal, round, and reactive to light.  Neck: Normal range of motion. Neck supple.  Cardiovascular: Normal rate, regular rhythm, normal heart sounds and intact distal pulses.  Exam reveals no gallop and no friction rub.   No murmur heard. Pulmonary/Chest: Effort normal and breath sounds normal. No respiratory distress. She has no wheezes.  Abdominal: Soft. Bowel sounds are normal. There is no tenderness. There is no rebound and no guarding.  Musculoskeletal: Normal range of motion. She exhibits no edema and no tenderness.  Neurological: She is alert and oriented to person, place, and time.  Skin: Skin is warm and dry.  Psychiatric: She has a normal mood and affect. Her behavior is normal.    ED Course  Procedures (including critical care time) Labs Review Labs Reviewed  BLOOD GAS, VENOUS - Abnormal; Notable for the following:    pH, Ven 7.387 (*)    pCO2, Ven 37.7 (*)    pO2, Ven 68.5 (*)    All other components within normal limits  COMPREHENSIVE METABOLIC PANEL - Abnormal; Notable for the following:    Sodium 131 (*)    Chloride 95 (*)    Glucose, Bld 509 (*)    Alkaline Phosphatase 121 (*)    All other components within normal limits  CBG MONITORING, ED - Abnormal; Notable for the following:    Glucose-Capillary 563 (*)    All other components within normal limits  CBG MONITORING, ED - Abnormal; Notable for the following:    Glucose-Capillary 396 (*)    All other components within normal limits  CBC WITH DIFFERENTIAL  CBG MONITORING, ED    Imaging Review No results found.   EKG Interpretation None      MDM   Final diagnoses:  Hyperglycemia    3:00 PM 53 y.o. female w hx of HTN, DM who pw BS in the 500's. The patient states that she took her Lantus last night  before bed also a Browning at the same time. She states that she ate breakfast this morning but forgot to take her NovoLog. Around noon she checked her blood sugar which was in the 500s. She drank some water and went on a walk but her blood sugar remained high. She did say that she took 15 units of NovoLog around 11 AM this morning when she realized she had forgotten her insulin with breakfast. She is afebrile and vital signs are unremarkable here. Labwork is thus far noncontributory. I do not think she is in DKA based on her labwork. Will hydrate and give insulin.  4:02 PM: I interpreted/reviewed the labs and/or imaging which were non-contributory.  BS decreasing appropriately. Pt cont to appear well.  I have discussed the diagnosis/risks/treatment options with the patient and believe the pt to be eligible for  discharge home to follow-up with her pcp as needed. We also discussed returning to the ED immediately if new or worsening sx occur. We discussed the sx which are most concerning (e.g., worsening BS) that necessitate immediate return. Medications administered to the patient during their visit and any new prescriptions provided to the patient are listed below.  Medications given during this visit Medications  sodium chloride 0.9 % bolus 1,000 mL (1,000 mLs Intravenous New Bag/Given 07/30/14 1511)  insulin aspart (novoLOG) injection 3 Units (3 Units Intravenous Given 07/30/14 1548)    New Prescriptions   No medications on file       Pamella Pert, MD 07/30/14 1611

## 2014-07-30 NOTE — Discharge Instructions (Signed)

## 2014-08-11 ENCOUNTER — Other Ambulatory Visit: Payer: Self-pay | Admitting: Gastroenterology

## 2014-08-11 NOTE — Anesthesia Preprocedure Evaluation (Addendum)
Anesthesia Evaluation  Patient identified by MRN, date of birth, ID band Patient awake    Reviewed: Allergy & Precautions, H&P , NPO status , Patient's Chart, lab work & pertinent test results  History of Anesthesia Complications (+) PONVNegative for: history of anesthetic complications  Airway Mallampati: II TM Distance: >3 FB Neck ROM: Full    Dental  (+) Poor Dentition, Partial Upper, Partial Lower, Dental Advisory Given   Pulmonary sleep apnea and Continuous Positive Airway Pressure Ventilation , Current Smoker,  breath sounds clear to auscultation  Pulmonary exam normal       Cardiovascular Exercise Tolerance: Good hypertension, Pt. on medications Rhythm:Regular Rate:Normal     Neuro/Psych Anxiety Depression negative neurological ROS     GI/Hepatic Neg liver ROS, GERD-  Medicated and Controlled,  Endo/Other  diabetes, Type 1, Insulin Dependent  Renal/GU negative Renal ROS  negative genitourinary   Musculoskeletal negative musculoskeletal ROS (+)   Abdominal   Peds negative pediatric ROS (+)  Hematology negative hematology ROS (+)   Anesthesia Other Findings   Reproductive/Obstetrics negative OB ROS                         Anesthesia Physical Anesthesia Plan  ASA: II  Anesthesia Plan: MAC   Post-op Pain Management:    Induction: Intravenous  Airway Management Planned:   Additional Equipment:   Intra-op Plan:   Post-operative Plan: Extubation in OR  Informed Consent: I have reviewed the patients History and Physical, chart, labs and discussed the procedure including the risks, benefits and alternatives for the proposed anesthesia with the patient or authorized representative who has indicated his/her understanding and acceptance.   Dental advisory given  Plan Discussed with: CRNA  Anesthesia Plan Comments:         Anesthesia Quick Evaluation

## 2014-08-12 ENCOUNTER — Encounter (HOSPITAL_COMMUNITY)
Admission: RE | Disposition: A | Discharge status: Home / Self Care | Payer: Self-pay | Source: Ambulatory Visit | Attending: Gastroenterology

## 2014-08-12 ENCOUNTER — Encounter (HOSPITAL_COMMUNITY): Payer: Self-pay | Admitting: *Deleted

## 2014-08-12 ENCOUNTER — Ambulatory Visit (HOSPITAL_COMMUNITY): Payer: Managed Care, Other (non HMO) | Admitting: Anesthesiology

## 2014-08-12 ENCOUNTER — Ambulatory Visit (HOSPITAL_COMMUNITY)
Admission: RE | Admit: 2014-08-12 | Discharge: 2014-08-12 | Disposition: A | Discharge status: Home / Self Care | Payer: Managed Care, Other (non HMO) | Source: Ambulatory Visit | Attending: Gastroenterology | Admitting: Gastroenterology

## 2014-08-12 ENCOUNTER — Encounter (HOSPITAL_COMMUNITY): Payer: Managed Care, Other (non HMO) | Admitting: Anesthesiology

## 2014-08-12 DIAGNOSIS — G4730 Sleep apnea, unspecified: Secondary | ICD-10-CM | POA: Diagnosis not present

## 2014-08-12 DIAGNOSIS — F17210 Nicotine dependence, cigarettes, uncomplicated: Secondary | ICD-10-CM | POA: Diagnosis not present

## 2014-08-12 DIAGNOSIS — D122 Benign neoplasm of ascending colon: Secondary | ICD-10-CM | POA: Diagnosis not present

## 2014-08-12 DIAGNOSIS — E108 Type 1 diabetes mellitus with unspecified complications: Secondary | ICD-10-CM | POA: Insufficient documentation

## 2014-08-12 DIAGNOSIS — I10 Essential (primary) hypertension: Secondary | ICD-10-CM | POA: Insufficient documentation

## 2014-08-12 DIAGNOSIS — D125 Benign neoplasm of sigmoid colon: Secondary | ICD-10-CM | POA: Insufficient documentation

## 2014-08-12 DIAGNOSIS — E780 Pure hypercholesterolemia: Secondary | ICD-10-CM | POA: Insufficient documentation

## 2014-08-12 DIAGNOSIS — Z1211 Encounter for screening for malignant neoplasm of colon: Secondary | ICD-10-CM | POA: Insufficient documentation

## 2014-08-12 HISTORY — DX: Acute embolism and thrombosis of unspecified vein: I82.90

## 2014-08-12 HISTORY — DX: Essential (primary) hypertension: I10

## 2014-08-12 HISTORY — DX: Other specified postprocedural states: R11.2

## 2014-08-12 HISTORY — DX: Sleep apnea, unspecified: G47.30

## 2014-08-12 HISTORY — DX: Other specified postprocedural states: Z98.890

## 2014-08-12 HISTORY — DX: Major depressive disorder, single episode, unspecified: F32.9

## 2014-08-12 HISTORY — DX: Sciatica, unspecified side: M54.30

## 2014-08-12 HISTORY — PX: COLONOSCOPY WITH PROPOFOL: SHX5780

## 2014-08-12 HISTORY — DX: Type 2 diabetes mellitus without complications: E11.9

## 2014-08-12 HISTORY — DX: Depression, unspecified: F32.A

## 2014-08-12 HISTORY — DX: Gastro-esophageal reflux disease without esophagitis: K21.9

## 2014-08-12 LAB — GLUCOSE, CAPILLARY: Glucose-Capillary: 121 mg/dL — ABNORMAL HIGH (ref 70–99)

## 2014-08-12 SURGERY — COLONOSCOPY WITH PROPOFOL
Anesthesia: Monitor Anesthesia Care

## 2014-08-12 MED ORDER — PROPOFOL 10 MG/ML IV EMUL
INTRAVENOUS | Status: DC | PRN
  Administered 2014-08-12: 40 mg via INTRAVENOUS
  Administered 2014-08-12: 60 mg via INTRAVENOUS

## 2014-08-12 MED ORDER — PROPOFOL 10 MG/ML IV BOLUS
INTRAVENOUS | Status: AC
  Filled 2014-08-12: qty 20

## 2014-08-12 MED ORDER — LIDOCAINE HCL (CARDIAC) 20 MG/ML IV SOLN
INTRAVENOUS | Status: AC
  Filled 2014-08-12: qty 5

## 2014-08-12 MED ORDER — LACTATED RINGERS IV SOLN
INTRAVENOUS | Status: DC | PRN
Start: 2014-08-12 — End: 2014-08-12
  Administered 2014-08-12: 10:00:00 via INTRAVENOUS

## 2014-08-12 MED ORDER — PROPOFOL INFUSION 10 MG/ML OPTIME
INTRAVENOUS | Status: DC | PRN
  Administered 2014-08-12: 140 ug/kg/min via INTRAVENOUS

## 2014-08-12 MED ORDER — LACTATED RINGERS IV SOLN
INTRAVENOUS | Status: DC
  Administered 2014-08-12: 1000 mL via INTRAVENOUS

## 2014-08-12 SURGICAL SUPPLY — 21 items

## 2014-08-12 NOTE — Transfer of Care (Signed)
Immediate Anesthesia Transfer of Care Note  Patient: Carrie Mcpherson  Procedure(s) Performed: Procedure(s): COLONOSCOPY WITH PROPOFOL (N/A)  Patient Location: PACU  Anesthesia Type:MAC  Level of Consciousness: awake, alert  and oriented  Airway & Oxygen Therapy: Patient Spontanous Breathing and Patient connected to face mask oxygen  Post-op Assessment: Report given to PACU RN and Post -op Vital signs reviewed and stable  Post vital signs: Reviewed and stable  Complications: No apparent anesthesia complications

## 2014-08-12 NOTE — Op Note (Signed)
Procedure: Baseline screening colonoscopy  Endoscopist: Earle Gell  Premedication: Propofol administered by anesthesia  Procedure: The patient was placed in the left lateral decubitus position. Anal inspection and digital rectal exam were normal. The Pentax pediatric colonoscope was introduced into the rectum and easily advanced to the cecum. A normal-appearing appendiceal orifice was identified. A normal-appearing ileocecal valve was intubated and the terminal ileum inspected. Colonic preparation for the exam today was good  Rectum. Normal. Retroflexed view of the distal rectum normal  Sigmoid colon. From the distal sigmoid colon, a 3 mm sessile polyp was removed with the cold snare.   Descending colon. Normal  Splenic flexure. Normal  Transverse colon. Normal  Hepatic flexure. Normal  Ascending colon. A 2 mm sessile polyp was removed from the proximal ascending colon with the cold biopsy forceps. A 5 mm sessile polyp was removed from the mid ascending colon with the cold snare.  Cecum and ileocecal valve. Normal  Terminal ileum. Normal  Assessment: Two small polyps were removed from the ascending colon and a small polyp was removed from the distal sigmoid colon  Recommendation: If the polyps return neoplastic pathologically, the patient should undergo a surveillance colonoscopy in 5 years. If the polyps returned nonneoplastic pathologically, the patient should undergo a repeat screening colonoscopy in 10 years

## 2014-08-12 NOTE — H&P (Signed)
  Procedure: Baseline screening colonoscopy  History: The patient is a 53 year old female born [birth year]. She is scheduled to undergo her first screening colonoscopy with polypectomy to prevent colon cancer.  Past medical history: Type 1 diabetes mellitus complicated by ketoacidosis. Hypertension. Hypercholesterolemia. Bilateral shoulder surgery.  Allergies: None  Exam: The patient is alert and lying comfortably on the endoscopy stretcher. Lungs are clear to auscultation. Cardiac exam reveals a regular. Abdomen is soft and nontender to palpation.  Plan: Proceed with baseline screening colonoscopy

## 2014-08-12 NOTE — Anesthesia Postprocedure Evaluation (Signed)
  Anesthesia Post-op Note  Patient: Carrie Mcpherson  Procedure(s) Performed: Procedure(s) (LRB): COLONOSCOPY WITH PROPOFOL (N/A)  Patient Location: PACU  Anesthesia Type: MAC  Level of Consciousness: awake and alert   Airway and Oxygen Therapy: Patient Spontanous Breathing  Post-op Pain: mild  Post-op Assessment: Post-op Vital signs reviewed, Patient's Cardiovascular Status Stable, Respiratory Function Stable, Patent Airway and No signs of Nausea or vomiting  Last Vitals:  Filed Vitals:   08/12/14 1200  BP:   Pulse: 49  Temp:   Resp: 17    Post-op Vital Signs: stable   Complications: No apparent anesthesia complications

## 2014-08-12 NOTE — Discharge Instructions (Signed)
Colonoscopy, Care After These instructions give you information on caring for yourself after your procedure. Your doctor may also give you more specific instructions. Call your doctor if you have any problems or questions after your procedure. HOME CARE  Do not drive for 24 hours.  Do not sign important papers or use machinery for 24 hours.  You may shower.  You may go back to your usual activities, but go slower for the first 24 hours.  Take rest breaks often during the first 24 hours.  Walk around or use warm packs on your belly (abdomen) if you have belly cramping or gas.  Drink enough fluids to keep your pee (urine) clear or pale yellow.  Resume your normal diet. Avoid heavy or fried foods.  Avoid drinking alcohol for 24 hours or as told by your doctor.  Only take medicines as told by your doctor. If a tissue sample (biopsy) was taken during the procedure:   Do not take aspirin or blood thinners for 7 days, or as told by your doctor.  Do not drink alcohol for 7 days, or as told by your doctor.  Eat soft foods for the first 24 hours. GET HELP IF: You still have a small amount of blood in your poop (stool) 2-3 days after the procedure. GET HELP RIGHT AWAY IF:  You have more than a small amount of blood in your poop.  You see clumps of tissue (blood clots) in your poop.  Your belly is puffy (swollen).  You feel sick to your stomach (nauseous) or throw up (vomit).  You have a fever.  You have belly pain that gets worse and medicine does not help. MAKE SURE YOU:  Understand these instructions.  Will watch your condition.  Will get help right away if you are not doing well or get worse. Document Released: 11/12/2010 Document Revised: 10/15/2013 Document Reviewed: 06/17/2013 [facility] Patient Information 2015 [place], [place]. This information is not intended to replace advice given to you by your health care provider. Make sure you discuss any questions you have with  your health care provider.   Monitored Anesthesia Care Monitored anesthesia care is an anesthesia service for a medical procedure. Anesthesia is the loss of the ability to feel pain. It is produced by medicines called anesthetics. It may affect a small area of your body (local anesthesia), a large area of your body (regional anesthesia), or your entire body (general anesthesia). The need for monitored anesthesia care depends your procedure, your condition, and the potential need for regional or general anesthesia. It is often provided during procedures where:   General anesthesia may be needed if there are complications. This is because you need special care when you are under general anesthesia.   You will be under local or regional anesthesia. This is so that you are able to have higher levels of anesthesia if needed.   You will receive calming medicines (sedatives). This is especially the case if sedatives are given to put you in a semi-conscious state of relaxation (deep sedation). This is because the amount of sedative needed to produce this state can be hard to predict. Too much of a sedative can produce general anesthesia. Monitored anesthesia care is performed by one or more health care providers who have special training in all types of anesthesia. You will need to meet with these health care providers before your procedure. During this meeting, they will ask you about your medical history. They will also give you instructions to  follow. (For example, you will need to stop eating and drinking before your procedure. You may also need to stop or change medicines you are taking.) During your procedure, your health care providers will stay with you. They will:   Watch your condition. This includes watching your blood pressure, breathing, and level of pain.   Diagnose and treat problems that occur.   Give medicines if they are needed. These may include calming medicines (sedatives) and  anesthetics.   Make sure you are comfortable.  Having monitored anesthesia care does not necessarily mean that you will be under anesthesia. It does mean that your health care providers will be able to manage anesthesia if you need it or if it occurs. It also means that you will be able to have a different type of anesthesia than you are having if you need it. When your procedure is complete, your health care providers will continue to watch your condition. They will make sure any medicines wear off before you are allowed to go home.  Document Released: 07/06/2005 Document Revised: 02/24/2014 Document Reviewed: 11/21/2012 [facility] Patient Information 2015 [place], [place]. This information is not intended to replace advice given to you by your health care provider. Make sure you discuss any questions you have with your health care provider.

## 2014-08-13 ENCOUNTER — Encounter (HOSPITAL_COMMUNITY): Payer: Self-pay | Admitting: Gastroenterology

## 2014-10-15 ENCOUNTER — Encounter: Payer: Self-pay | Admitting: *Deleted

## 2014-10-15 ENCOUNTER — Ambulatory Visit: Payer: Commercial Indemnity | Admitting: *Deleted

## 2014-10-15 ENCOUNTER — Encounter: Payer: Commercial Indemnity | Attending: Internal Medicine | Admitting: *Deleted

## 2014-10-15 DIAGNOSIS — E108 Type 1 diabetes mellitus with unspecified complications: Secondary | ICD-10-CM | POA: Diagnosis not present

## 2014-10-15 DIAGNOSIS — Z713 Dietary counseling and surveillance: Secondary | ICD-10-CM | POA: Diagnosis not present

## 2014-10-15 DIAGNOSIS — Z794 Long term (current) use of insulin: Secondary | ICD-10-CM | POA: Insufficient documentation

## 2014-10-20 ENCOUNTER — Encounter: Payer: Commercial Indemnity | Admitting: *Deleted

## 2014-10-20 DIAGNOSIS — E108 Type 1 diabetes mellitus with unspecified complications: Secondary | ICD-10-CM

## 2014-10-31 ENCOUNTER — Encounter: Payer: Commercial Indemnity | Attending: Internal Medicine | Admitting: *Deleted

## 2014-10-31 DIAGNOSIS — E1065 Type 1 diabetes mellitus with hyperglycemia: Secondary | ICD-10-CM

## 2014-10-31 DIAGNOSIS — IMO0002 Reserved for concepts with insufficient information to code with codable children: Secondary | ICD-10-CM

## 2014-10-31 DIAGNOSIS — Z713 Dietary counseling and surveillance: Secondary | ICD-10-CM | POA: Insufficient documentation

## 2014-10-31 DIAGNOSIS — Z794 Long term (current) use of insulin: Secondary | ICD-10-CM | POA: Insufficient documentation

## 2014-10-31 DIAGNOSIS — E108 Type 1 diabetes mellitus with unspecified complications: Secondary | ICD-10-CM | POA: Insufficient documentation

## 2014-10-31 NOTE — Progress Notes (Signed)
Introduction to Insulin Pump Therapy:  Appt start time: 1530 end time:  1700.  Assessment:  This patient has DM 1 and their primary concerns today: to get ready to start on her new T-Slim insulin pump which she brought with her to the appointment.   MEDICATIONS: Basal Insulin: 32 units of Lantus at bedtime via pen or syringe Bolus Insulin: 26 units of Novolog at Carb Ratio of 1/6 grams per meal  via pen or syringe Total of insulin doses per day 58  This patient is currently adjusting bolus insulin based BG at a correction ratio of 1/50 mg/dl over 100 This patient is currently adjusting bolus insulin based on carb intake at ratio of 1/6 grams  Patient states knowledge of Carb Counting is fair  Patient currently is working full time at [employer] and the schedule is variable  Progress Towards Obtaining an Insulin PumpGoal(s):  In progress.  Patient states their expectations of pump therapy include: improved control and flexibility Patient expresses understanding that for improved outcomes for their diabetes on an insulin pump they will:  Check BG 4 or more times per day  Change out pump infusion set at least every 3 days  Upload pump information to software on a regular basis so provider can assess patterns and make setting adjustments.     Intervention:    Reviewed difference between delivery of insulin via syringe/pen compared to insulin pump.  Demonstrated improved insulin delivery via pump due to improved accuracy of dose and flexibility of adjusting bolus insulin based on carb intake and BG correction.  Explained importance of testing BG at least 4 times per day for appropriate correction of high BG and prevention of DKA as applicable.  Emphasized importance of follow up after Pump Start for appropriate pump setting adjustments and on-going training on more advanced features.  Handouts given during visit include:  Intro to Pumping handout  Yellow Meal Plan card  for Carb Counting  Monitoring/Evaluation:    Pump Start planned for next week, December 28th, once orders and Rx's can be obtained.

## 2014-10-31 NOTE — Patient Instructions (Signed)
Plan: We have decreased your Basal Rate from 1.10 to .90 u/hr to help decrease risk for low BG especially in AM We have decreased your Sensitivity Factor from 40 to 50 to prevent lows after you correct a high BG Continue to use the pump to put in your Carbs and BG so insulin bolus can be more accurate Upload your pump to T-connect in about 2 weeks so I can adjust your settings We set up your T-Connect account so you can do this from home now.

## 2014-10-31 NOTE — Progress Notes (Signed)
Insulin Pump Start Progress Note:10/20/14  Patient appointment start time: 1230  End time 1430  Patient here for insulin pump start on T-Slim pump and Inset infusion set Orders with pump settings received from MD Patient completed Pre- training by reading User Guide and practicing with pump  Reviewed Pump Set Up including  Menu Settings  Bolus with Carb Ratio of 1 unit / 10 grams Carb, Correction Factor of 1 unit / 40 mg/dl, target of 100 mg/dl  Suspend  Basal with initial Basal Rate of 1.00 units/hour  Reservoir Set Up  Utilities Pump Training Checklist completed Used Temp Basal of 8 hour duration @ 10 % basal due to patient taking their long acting insulin yesterday at 9 PM  Patient is signed up for [employer] and agrees to upload PRN for review of progress and allow for pump setting adjustments  Patient successfully completed pump start and instructed to call me if BG drops below 60 mg/dl or goes above 300 mg/dl or as directed by MD  Follow up plan: plan to see patient within 2 weeks to assess BG control and make pump setting changes as needed

## 2014-11-07 NOTE — Progress Notes (Signed)
  Pump Follow Up Progress Note 10/31/2014  Orders received from MD giving me permission to make insulin pump adjustments for this patient.  Reviewed blood glucose logs on 10/31/2014 and found the following:            Hypoglycemia Hyperglycemia Comments  Overnight Period:      Pre-Meal:    Breakfast YES  BASAL & POST CORRECTION BOLUS   Lunch      Supper     Post-Meal: Breakfast      Lunch      Supper     Bedtime:       Comments: Due to frequent hypoglycemia in the AM and mid afternoon after correcting high BG's mid day I will decrease her Basal Rate and her ISF today.   Pump Settings: Date: Current Date: 10/31/2014    changes are in bold print   Basal Rate: Carb Ratio Sensitivity  Basal Rate: Carb Ratio Sensitivity   MN: 1.10 8 40 MN: 0.90  (-) 8 50 (-)                                                         Plan: patient to continue to use T-Slim pump and to allow pump to make insulin dose decisions based on Carb and BG information. Patient to contact me if BG below 70 or above 300 mg/dl. We discussed:   use of Temp Basal   Carb Counting review by [employer]  Reading food labels for Total Carb of foods    Follow up: Informed of DM 1 Support Group Patient to follow up with me within 1 month or sooner if needed

## 2015-01-12 ENCOUNTER — Other Ambulatory Visit (HOSPITAL_COMMUNITY)
Admission: RE | Admit: 2015-01-12 | Discharge: 2015-01-12 | Disposition: A | Discharge status: Home / Self Care | Payer: Managed Care, Other (non HMO) | Source: Ambulatory Visit | Attending: Internal Medicine | Admitting: Internal Medicine

## 2015-01-12 ENCOUNTER — Other Ambulatory Visit: Payer: Self-pay | Admitting: Internal Medicine

## 2015-01-12 DIAGNOSIS — Z01419 Encounter for gynecological examination (general) (routine) without abnormal findings: Secondary | ICD-10-CM | POA: Diagnosis present

## 2015-01-15 LAB — CYTOLOGY - PAP

## 2015-10-07 ENCOUNTER — Emergency Department (HOSPITAL_BASED_OUTPATIENT_CLINIC_OR_DEPARTMENT_OTHER): Payer: Managed Care, Other (non HMO)

## 2015-10-07 ENCOUNTER — Encounter (HOSPITAL_BASED_OUTPATIENT_CLINIC_OR_DEPARTMENT_OTHER): Payer: Self-pay

## 2015-10-07 ENCOUNTER — Inpatient Hospital Stay (HOSPITAL_BASED_OUTPATIENT_CLINIC_OR_DEPARTMENT_OTHER)
Admission: EM | Admit: 2015-10-07 | Discharge: 2015-10-08 | DRG: 638 | Disposition: A | Discharge status: Home / Self Care | Payer: Managed Care, Other (non HMO) | Attending: Internal Medicine | Admitting: Internal Medicine

## 2015-10-07 DIAGNOSIS — J029 Acute pharyngitis, unspecified: Secondary | ICD-10-CM | POA: Diagnosis present

## 2015-10-07 DIAGNOSIS — E860 Dehydration: Secondary | ICD-10-CM | POA: Diagnosis present

## 2015-10-07 DIAGNOSIS — E1010 Type 1 diabetes mellitus with ketoacidosis without coma: Principal | ICD-10-CM | POA: Diagnosis present

## 2015-10-07 DIAGNOSIS — E1310 Other specified diabetes mellitus with ketoacidosis without coma: Secondary | ICD-10-CM | POA: Diagnosis not present

## 2015-10-07 DIAGNOSIS — F329 Major depressive disorder, single episode, unspecified: Secondary | ICD-10-CM | POA: Diagnosis present

## 2015-10-07 DIAGNOSIS — Z9641 Presence of insulin pump (external) (internal): Secondary | ICD-10-CM | POA: Diagnosis present

## 2015-10-07 DIAGNOSIS — Z720 Tobacco use: Secondary | ICD-10-CM | POA: Diagnosis not present

## 2015-10-07 DIAGNOSIS — F17200 Nicotine dependence, unspecified, uncomplicated: Secondary | ICD-10-CM | POA: Diagnosis present

## 2015-10-07 DIAGNOSIS — E1110 Type 2 diabetes mellitus with ketoacidosis without coma: Secondary | ICD-10-CM | POA: Diagnosis present

## 2015-10-07 DIAGNOSIS — I10 Essential (primary) hypertension: Secondary | ICD-10-CM | POA: Diagnosis present

## 2015-10-07 DIAGNOSIS — Z86718 Personal history of other venous thrombosis and embolism: Secondary | ICD-10-CM

## 2015-10-07 DIAGNOSIS — N179 Acute kidney failure, unspecified: Secondary | ICD-10-CM | POA: Diagnosis present

## 2015-10-07 DIAGNOSIS — K219 Gastro-esophageal reflux disease without esophagitis: Secondary | ICD-10-CM | POA: Diagnosis present

## 2015-10-07 DIAGNOSIS — F32A Depression, unspecified: Secondary | ICD-10-CM | POA: Diagnosis present

## 2015-10-07 DIAGNOSIS — Z794 Long term (current) use of insulin: Secondary | ICD-10-CM

## 2015-10-07 LAB — CBC WITH DIFFERENTIAL/PLATELET
Basophils Absolute: 0 10*3/uL (ref 0.0–0.1)
Basophils Relative: 0 %
Eosinophils Absolute: 0 10*3/uL (ref 0.0–0.7)
Eosinophils Relative: 0 %
HCT: 40 % (ref 36.0–46.0)
Hemoglobin: 13.1 g/dL (ref 12.0–15.0)
Lymphocytes Relative: 8 %
Lymphs Abs: 0.8 10*3/uL (ref 0.7–4.0)
MCH: 27.8 pg (ref 26.0–34.0)
MCHC: 32.8 g/dL (ref 30.0–36.0)
MCV: 84.9 fL (ref 78.0–100.0)
Monocytes Absolute: 0.3 10*3/uL (ref 0.1–1.0)
Monocytes Relative: 3 %
Neutro Abs: 8.8 10*3/uL — ABNORMAL HIGH (ref 1.7–7.7)
Neutrophils Relative %: 89 %
Platelets: 219 10*3/uL (ref 150–400)
RBC: 4.71 MIL/uL (ref 3.87–5.11)
RDW: 14.9 % (ref 11.5–15.5)
WBC: 10 10*3/uL (ref 4.0–10.5)

## 2015-10-07 LAB — BASIC METABOLIC PANEL
Anion gap: 7 (ref 5–15)
BUN: 27 mg/dL — ABNORMAL HIGH (ref 6–20)
CO2: 24 mmol/L (ref 22–32)
Calcium: 8.7 mg/dL — ABNORMAL LOW (ref 8.9–10.3)
Chloride: 112 mmol/L — ABNORMAL HIGH (ref 101–111)
Creatinine, Ser: 1.17 mg/dL — ABNORMAL HIGH (ref 0.44–1.00)
GFR calc Af Amer: 59 mL/min — ABNORMAL LOW (ref >60)
GFR calc non Af Amer: 51 mL/min — ABNORMAL LOW (ref >60)
Glucose, Bld: 266 mg/dL — ABNORMAL HIGH (ref 65–99)
Potassium: 4.3 mmol/L (ref 3.5–5.1)
Sodium: 143 mmol/L (ref 135–145)

## 2015-10-07 LAB — GLUCOSE, CAPILLARY
Glucose-Capillary: 418 mg/dL — ABNORMAL HIGH (ref 65–99)
Glucose-Capillary: 308 mg/dL — ABNORMAL HIGH (ref 65–99)
Glucose-Capillary: 284 mg/dL — ABNORMAL HIGH (ref 65–99)
Glucose-Capillary: 240 mg/dL — ABNORMAL HIGH (ref 65–99)
Glucose-Capillary: 200 mg/dL — ABNORMAL HIGH (ref 65–99)
Glucose-Capillary: 181 mg/dL — ABNORMAL HIGH (ref 65–99)
Glucose-Capillary: 434 mg/dL — ABNORMAL HIGH (ref 65–99)
Glucose-Capillary: 555 mg/dL (ref 65–99)
Glucose-Capillary: 533 mg/dL — ABNORMAL HIGH (ref 65–99)

## 2015-10-07 LAB — URINALYSIS, ROUTINE W REFLEX MICROSCOPIC
Bilirubin Urine: NEGATIVE
Glucose, UA: >1000 mg/dL — AB
Hgb urine dipstick: NEGATIVE
Ketones, ur: 40 mg/dL — AB
Leukocytes, UA: NEGATIVE
Nitrite: POSITIVE — AB
Protein, ur: NEGATIVE mg/dL
Specific Gravity, Urine: 1.026 (ref 1.005–1.030)
pH: 5.5 (ref 5.0–8.0)

## 2015-10-07 LAB — CBG MONITORING, ED
Glucose-Capillary: >600 mg/dL (ref 65–99)
Glucose-Capillary: 570 mg/dL (ref 65–99)

## 2015-10-07 LAB — COMPREHENSIVE METABOLIC PANEL
ALT: 12 U/L — ABNORMAL LOW (ref 14–54)
AST: 22 U/L (ref 15–41)
Albumin: 4.5 g/dL (ref 3.5–5.0)
Alkaline Phosphatase: 116 U/L (ref 38–126)
Anion gap: 23 — ABNORMAL HIGH (ref 5–15)
BUN: 34 mg/dL — ABNORMAL HIGH (ref 6–20)
CO2: 14 mmol/L — ABNORMAL LOW (ref 22–32)
Calcium: 9.5 mg/dL (ref 8.9–10.3)
Chloride: 98 mmol/L — ABNORMAL LOW (ref 101–111)
Creatinine, Ser: 1.31 mg/dL — ABNORMAL HIGH (ref 0.44–1.00)
GFR calc Af Amer: 52 mL/min — ABNORMAL LOW (ref >60)
GFR calc non Af Amer: 45 mL/min — ABNORMAL LOW (ref >60)
Glucose, Bld: 772 mg/dL (ref 65–99)
Potassium: 4.8 mmol/L (ref 3.5–5.1)
Sodium: 135 mmol/L (ref 135–145)
Total Bilirubin: 1.7 mg/dL — ABNORMAL HIGH (ref 0.3–1.2)
Total Protein: 7.6 g/dL (ref 6.5–8.1)

## 2015-10-07 LAB — URINE MICROSCOPIC-ADD ON: RBC / HPF: NONE SEEN RBC/hpf (ref 0–5)

## 2015-10-07 LAB — TROPONIN I: Troponin I: <0.03 ng/mL (ref <0.031)

## 2015-10-07 LAB — LIPASE, BLOOD: Lipase: 19 U/L (ref 11–51)

## 2015-10-07 LAB — MRSA PCR SCREENING: MRSA by PCR: NEGATIVE

## 2015-10-07 LAB — RAPID STREP SCREEN (MED CTR MEBANE ONLY): Streptococcus, Group A Screen (Direct): NEGATIVE

## 2015-10-07 MED ORDER — DEXTROSE-NACL 5-0.45 % IV SOLN
INTRAVENOUS | Status: DC
  Administered 2015-10-07: 12:00:00 via INTRAVENOUS

## 2015-10-07 MED ORDER — PNEUMOCOCCAL VAC POLYVALENT 25 MCG/0.5ML IJ INJ
0.5000 mL | INJECTION | INTRAMUSCULAR | Status: AC
  Administered 2015-10-08: 0.5 mL via INTRAMUSCULAR
  Filled 2015-10-07: qty 0.5

## 2015-10-07 MED ORDER — DEXTROSE 50 % IV SOLN: 25.0000 mL | INTRAVENOUS | Status: DC | PRN

## 2015-10-07 MED ORDER — LOSARTAN POTASSIUM 25 MG PO TABS: 25.0000 mg | ORAL_TABLET | Freq: Every morning | ORAL | Status: DC

## 2015-10-07 MED ORDER — SODIUM CHLORIDE 0.9 % IV BOLUS (SEPSIS)
1000.0000 mL | Freq: Once | INTRAVENOUS | Status: AC
  Administered 2015-10-07: 1000 mL via INTRAVENOUS

## 2015-10-07 MED ORDER — INSULIN PUMP
Freq: Three times a day (TID) | SUBCUTANEOUS | Status: DC
  Administered 2015-10-07 (×2): via SUBCUTANEOUS
  Filled 2015-10-07: qty 1

## 2015-10-07 MED ORDER — SODIUM CHLORIDE 0.9 % IV SOLN
INTRAVENOUS | Status: DC
  Administered 2015-10-07: 5.4 [IU]/h via INTRAVENOUS
  Filled 2015-10-07: qty 2.5

## 2015-10-07 MED ORDER — METOCLOPRAMIDE HCL 5 MG/ML IJ SOLN
5.0000 mg | Freq: Once | INTRAMUSCULAR | Status: AC
  Administered 2015-10-07: 5 mg via INTRAVENOUS
  Filled 2015-10-07: qty 2

## 2015-10-07 MED ORDER — ONDANSETRON HCL 4 MG/2ML IJ SOLN
4.0000 mg | Freq: Four times a day (QID) | INTRAMUSCULAR | Status: DC | PRN
  Administered 2015-10-07: 4 mg via INTRAVENOUS
  Filled 2015-10-07 (×2): qty 2

## 2015-10-07 MED ORDER — POTASSIUM CHLORIDE 10 MEQ/100ML IV SOLN
10.0000 meq | INTRAVENOUS | Status: AC
  Administered 2015-10-07: 10 meq via INTRAVENOUS
  Filled 2015-10-07 (×2): qty 100

## 2015-10-07 MED ORDER — ASPIRIN EC 81 MG PO TBEC
81.0000 mg | DELAYED_RELEASE_TABLET | Freq: Every day | ORAL | Status: DC
  Administered 2015-10-07 – 2015-10-08 (×2): 81 mg via ORAL
  Filled 2015-10-07 (×2): qty 1

## 2015-10-07 MED ORDER — INSULIN REGULAR HUMAN 100 UNIT/ML IJ SOLN
INTRAMUSCULAR | Status: DC
  Filled 2015-10-07: qty 2.5

## 2015-10-07 MED ORDER — ATORVASTATIN CALCIUM 10 MG PO TABS
10.0000 mg | ORAL_TABLET | Freq: Every day | ORAL | Status: DC
  Administered 2015-10-07: 10 mg via ORAL
  Filled 2015-10-07: qty 1

## 2015-10-07 MED ORDER — ENOXAPARIN SODIUM 40 MG/0.4ML ~~LOC~~ SOLN
40.0000 mg | SUBCUTANEOUS | Status: DC
  Administered 2015-10-07 – 2015-10-08 (×2): 40 mg via SUBCUTANEOUS
  Filled 2015-10-07 (×2): qty 0.4

## 2015-10-07 MED ORDER — SODIUM CHLORIDE 0.9 % IV SOLN: INTRAVENOUS | Status: DC

## 2015-10-07 MED ORDER — ONDANSETRON HCL 4 MG/2ML IJ SOLN
INTRAMUSCULAR | Status: AC
  Filled 2015-10-07: qty 2

## 2015-10-07 MED ORDER — INSULIN ASPART 100 UNIT/ML ~~LOC~~ SOLN
10.0000 [IU] | Freq: Once | SUBCUTANEOUS | Status: AC
  Administered 2015-10-07: 10 [IU] via INTRAVENOUS
  Filled 2015-10-07: qty 1

## 2015-10-07 MED ORDER — INSULIN REGULAR BOLUS VIA INFUSION
0.0000 [IU] | Freq: Three times a day (TID) | INTRAVENOUS | Status: DC
  Filled 2015-10-07: qty 10

## 2015-10-07 MED ORDER — HYDRALAZINE HCL 20 MG/ML IJ SOLN: 5.0000 mg | Freq: Four times a day (QID) | INTRAMUSCULAR | Status: DC | PRN

## 2015-10-07 MED ORDER — SODIUM CHLORIDE 0.9 % IV SOLN
INTRAVENOUS | Status: DC
  Administered 2015-10-07: 2.5 [IU]/h via INTRAVENOUS
  Filled 2015-10-07: qty 2.5

## 2015-10-07 MED ORDER — SERTRALINE HCL 100 MG PO TABS: 100.0000 mg | ORAL_TABLET | Freq: Every morning | ORAL | Status: DC

## 2015-10-07 MED ORDER — PHENOL 1.4 % MT LIQD: 1.0000 | OROMUCOSAL | Status: DC | PRN

## 2015-10-07 MED ORDER — SODIUM CHLORIDE 0.9 % IV SOLN: INTRAVENOUS | Status: AC

## 2015-10-07 MED ORDER — ONDANSETRON HCL 4 MG/2ML IJ SOLN: 4.0000 mg | Freq: Once | INTRAMUSCULAR | Status: DC

## 2015-10-07 MED ORDER — FAMOTIDINE 20 MG PO TABS: 20.0000 mg | ORAL_TABLET | Freq: Every day | ORAL | Status: DC

## 2015-10-07 MED ORDER — ONDANSETRON HCL 4 MG/2ML IJ SOLN
4.0000 mg | Freq: Once | INTRAMUSCULAR | Status: AC
  Administered 2015-10-07: 4 mg via INTRAVENOUS

## 2015-10-07 MED ORDER — SERTRALINE HCL 50 MG PO TABS
150.0000 mg | ORAL_TABLET | Freq: Every day | ORAL | Status: DC
  Administered 2015-10-07 – 2015-10-08 (×2): 150 mg via ORAL
  Filled 2015-10-07 (×2): qty 1

## 2015-10-07 MED ORDER — DEXTROSE-NACL 5-0.45 % IV SOLN: INTRAVENOUS | Status: DC

## 2015-10-07 MED ORDER — PANTOPRAZOLE SODIUM 40 MG IV SOLR
40.0000 mg | Freq: Two times a day (BID) | INTRAVENOUS | Status: DC
  Administered 2015-10-07 – 2015-10-08 (×3): 40 mg via INTRAVENOUS
  Filled 2015-10-07 (×3): qty 40

## 2015-10-07 MED ORDER — SODIUM CHLORIDE 0.9 % IV SOLN
INTRAVENOUS | Status: DC
  Administered 2015-10-07 (×2): via INTRAVENOUS

## 2015-10-07 MED ORDER — PANTOPRAZOLE SODIUM 40 MG PO TBEC: 40.0000 mg | DELAYED_RELEASE_TABLET | Freq: Every day | ORAL | Status: DC

## 2015-10-07 MED ORDER — DEXTROSE-NACL 5-0.45 % IV SOLN
INTRAVENOUS | Status: DC
  Administered 2015-10-08: 06:00:00 via INTRAVENOUS

## 2015-10-07 NOTE — ED Notes (Signed)
PT reports emesis since last night after eating a Pork Chop Biscuit from [place].

## 2015-10-07 NOTE — Progress Notes (Addendum)
Inpatient Diabetes Program Recommendations  AACE/ADA: New Consensus Statement on Inpatient Glycemic Control (2015)  Target Ranges:  Prepandial:   less than 140 mg/dL      Peak postprandial:   less than 180 mg/dL (1-2 hours)      Critically ill patients:  140 - 180 mg/dL   Review of Glycemic Control  Diabetes history: DM 1 Outpatient Diabetes medications: Novolog Insulin Pump Current orders for Inpatient glycemic control: Insulin gtt  Inpatient Diabetes Program Recommendations: Spoke with patient about her insulinpump management at home. Patient has been on a pump since Dec 28 th, 2015. Patient reports not ever having a GI bug after eating before and has never had N/V while being on the pump. Patient was not checking her glucose during her acute illness and covering her glucose. She was trying to interchange between regular and diet Ginger ale and Gatorade. She also ate some of a can of chicken noodle soup. Patient reported being scared of going into a coma like she did back in 2002. Explained to patient about sick day guidelines. Patient just refilled pump with Novolog yesterday and has all of her supplies at bedside. Patient prefers to restart pump today.  Insulin - IV drip/GlucoStabilizer: When transtioning from IV insulin gtt, please allow patient to resume her insulin pump using her same injection site.  Be sure to enter the insulin pump order from the order set.   RN to get patient to sign the insulin pump contract, wrench in and fill out the insulin pump flow sheet in EPIC Q shift.  Thanks,  Tama Headings RN, MSN, [facility] Inpatient Diabetes Coordinator Team Pager [phone] (8a-5p)

## 2015-10-07 NOTE — H&P (Signed)
[facility] History and Physical                                                                                    Carrie Mcpherson, is a 54 y.o. female  MRN: [id number]   DOB - [birth year]  Admit Date - 10/07/2015  Outpatient Primary MD for the patient is Shamleffer, Herschell Dimes, MD  Endocrinologist:  Dr. Barbette Hair  Referring Physician:  Dr. Dina Rich  Chief Complaint:   Chief Complaint  Patient presents with  . Emesis     HPI  Carrie Mcpherson  is a 54 y.o. female, with type 1 diabetes, hypertension, GERD, and depression who was transferred to [facility] from [facility] with DKA. The patient reports she was working at [employer] and had to leave her job early due to chills and feeling poorly. She developed a sore throat and began severe vomiting on her drive home from work. Overnight she attempted to sip water but continued to vomit. She had her last episode of DKA in 2002 set off by gastroenteritis. Feeling that she was going into DKA once again she presented for evaluation. Normally she does very well with stable CBGs on her insulin pump.  At [facility] her CBG was 772 and her CO2 was 14.  In addition her creatinine was elevated at 1.31 up from a baseline of 0.72. On exam the patient's pharynx appears red and irritated and she has swollen anterior lymph nodes.  Review of Systems  Constitutional: Positive for chills and malaise/fatigue.  HENT: Positive for sore throat.   Eyes: Negative.   Respiratory: Positive for cough.   Cardiovascular: Negative.   Gastrointestinal: Positive for heartburn, nausea and vomiting. Negative for diarrhea, constipation, blood in stool and melena.  Genitourinary: Negative for dysuria.  Musculoskeletal: Negative.   Neurological: Negative.   Endo/Heme/Allergies: Negative.   Psychiatric/Behavioral: Negative.      Past Medical History  Past Medical History  Diagnosis Date  . Hypertension   . Sleep apnea sept 2014    borderline,  no cpap needed  . Diabetes mellitus without complication ([place])   . Depression   . GERD (gastroesophageal reflux disease)     occasional  . Sciatic leg pain     right leg  . Blood clot in vein 1994     superficial after right ankle fracture  . PONV (postoperative nausea and vomiting)     vomiting after one surgery x 1    Past Surgical History  Procedure Laterality Date  . Rotator cuff repair Bilateral right 2004, left 1999  . Colonoscopy with propofol N/A 08/12/2014    Procedure: COLONOSCOPY WITH PROPOFOL;  Surgeon: Garlan Fair, MD;  Location: [facility] ENDOSCOPY;  Service: Endoscopy;  Laterality: N/A;      Social History Social History  Substance Use Topics  . Smoking status: Current Every Day Smoker -- 0.50 packs/day for 30 years    Types: Cigarettes  . Smokeless tobacco: Never Used  . Alcohol Use: Yes     Comment: occasional    Family History Father is alive at 39 and has COPD.  Brother is a Type II Dm who  has had a CVA after an MVA  Prior to Admission medications   Medication Sig Start Date End Date Taking? Authorizing Provider  aspirin EC 81 MG tablet Take 81 mg by mouth daily.   Yes Historical Provider, MD  atorvastatin (LIPITOR) 10 MG tablet Take 10 mg by mouth every morning.   Yes Historical Provider, MD  insulin aspart (NOVOLOG) 100 UNIT/ML injection Inject 5-10 Units into the skin 3 (three) times daily before meals.   Yes Historical Provider, MD  insulin glargine (LANTUS) 100 UNIT/ML injection Inject 34 Units into the skin at bedtime.   Yes Historical Provider, MD  losartan (COZAAR) 25 MG tablet Take 25 mg by mouth every morning.   Yes Historical Provider, MD  Omeprazole (PRILOSEC PO) Take by mouth.   Yes Historical Provider, MD  sertraline (ZOLOFT) 100 MG tablet Take 100 mg by mouth every morning.   Yes Historical Provider, MD  triamterene-hydrochlorothiazide (MAXZIDE-25) 37.5-25 MG per tablet Take 0.5 tablets by mouth every morning.   Yes Historical Provider, MD   ranitidine (ZANTAC) 150 MG tablet Take 150 mg by mouth 2 (two) times daily.    Historical Provider, MD    No Known Allergies  Physical Exam  Vitals  Blood pressure 113/51, pulse 69, temperature 97.7 F (36.5 C), temperature source Oral, resp. rate 22, height 5\' 5"  (1.651 m), weight 86.8 kg (191 lb 5.8 oz), SpO2 94 %.   General: Wd, Wn, Poor dentition, sitting on the side of the bed talking on the phone, NAD.  Psych:  Normal affect and insight, Not Suicidal or Homicidal, Awake Alert, Oriented X 3.  Neuro:   No F.N deficits, ALL C.Nerves Intact, Strength 5/5 all 4 extremities, Sensation intact all 4 extremities.  ENT:  Ears and Eyes appear Normal, Conjunctivae clear, PER. Moist oral mucosa without erythema or exudates.  Oropharynx is erythematous with an exudate.  Neck:  Supple, ++ left sided anterior swollen lymph node.  Respiratory:  Symmetrical chest wall movement, Good air movement bilaterally, CTAB.  Cardiac:  RRR, No Murmurs, no LE edema noted, no JVD.    Abdomen:  Positive bowel sounds, Soft, Non tender, Non distended,  No masses appreciated  Skin:  No Cyanosis, Normal Skin Turgor, No Skin Rash or Bruise.  Extremities:  Able to move all 4. 5/5 strength in each,  no effusions.  Data Review  Wt Readings from Last 3 Encounters:  10/07/15 86.8 kg (191 lb 5.8 oz)  08/12/14 89.812 kg (198 lb)    CBC  Recent Labs Lab 10/07/15 0500  WBC 10.0  HGB 13.1  HCT 40.0  PLT 219  MCV 84.9  MCH 27.8  MCHC 32.8  RDW 14.9  LYMPHSABS 0.8  MONOABS 0.3  EOSABS 0.0  BASOSABS 0.0    Chemistries   Recent Labs Lab 10/07/15 0500  NA 135  K 4.8  CL 98*  CO2 14*  GLUCOSE 772*  BUN 34*  CREATININE 1.31*  CALCIUM 9.5  AST 22  ALT 12*  ALKPHOS 116  BILITOT 1.7*      Cardiac Enzymes  Recent Labs Lab 10/07/15 0500  TROPONINI <0.03     Imaging results:   Dg Abd Acute W/chest  10/07/2015  CLINICAL DATA:  Acute onset of nausea and vomiting. Hyperglycemia.  Initial encounter. EXAM: DG ABDOMEN ACUTE W/ 1V CHEST COMPARISON:  None. FINDINGS: The lungs are well-aerated. Minimal bibasilar atelectasis is noted. There is no evidence of pleural effusion or pneumothorax. The cardiomediastinal silhouette is within normal limits. The  visualized bowel gas pattern is unremarkable. Scattered stool and air are seen within the colon; there is no evidence of small bowel dilatation to suggest obstruction. No free intra-abdominal air is identified on the provided upright view. A small amount of air and fluid is seen within the stomach. No acute osseous abnormalities are seen; the sacroiliac joints are unremarkable in appearance. IMPRESSION: 1. Unremarkable bowel gas pattern; no free intra-abdominal air seen. Small amount of stool noted in the colon. 2. Minimal bibasilar atelectasis noted. Electronically Signed   By: Garald Balding M.D.   On: 10/07/2015 06:44    My personal review of EKG: sinus rhythm, with borderline prolonged QT.   Assessment & Plan  Principal Problem:   DKA (diabetic ketoacidoses) ([place]) Active Problems:   AKI (acute kidney injury) ([place])   Essential hypertension   GERD (gastroesophageal reflux disease)   Depression   Acute pharyngitis   DKA In a type I diabetic normally on an insulin pump.  Likely set off by viral or bacterial infection. Patient has been placed on DKA protocol and an insulin drip with BMETs q 4 hours.  Have consulted the DM coordinator for recommendations about transitioning from the insulin drip to SQ insulin and back to the pump.  AKI Likely due to DKA causing dehydration.  Receiving fluids.  Will follow bmet.  Hold losartan. U/A pending.   Possible pharyngitis Given vomiting and chills will check for Strep.  Supportive care.  Will start antibiotics if positive for strep.   Elevated bilirubin (1.7)  Likely due to DKA. Will recheck LFTs in a.m.   HTN Hold certain due to AKI and HCTZ due to dehydration.  Will add  hydralazine PRN in case her blood pressure rises.  Hypercholesterolemia Continue atorvastatin.   GERD Placed on IV PPI as patient has recently had significant vomiting.  Depression Continue sertraline at 150 mg per patient.     Consultants Called:   None   Family Communication:     Patient is alert, orientated and understands their plan of care.   Code Status:    Full   Condition:    Guarded.   Potential Disposition:   To home in 24 - 48 hours if improved.   Time spent in minutes : [place],  [place] on 10/07/2015 at 8:47 AM Between 7am to 7pm - Pager - [phone] After 7pm go to [email or web address] - password [email or web address] And look for the night coverage person covering me after hours

## 2015-10-07 NOTE — Progress Notes (Signed)
Pending on admission from [facility] per Dr. Rochele Raring.  54 year old lady with past medical history diabetes mellitus on insulin pump, hypertension, hyperlipidemia, GERD, depression, who presents with nausea and vomiting since last night. No abdominal pain or diarrhea. No symptoms of UTI per EDP.  She was found to have DKA with AG 23, bicarbonate 14, potassium 4.8, blood sugar 772, troponin negative, lipase 19, total bilirubin 1.7 with normal AST/ALT, AKI with creatinine 1.34. Pending urinalysis. 2 L of normal saline bolus was given ED, I asked EDP to give another liter of normal saline bolus. IV insulin drip started in ED. Accepted to [facility].  Ivor Costa, MD  [facility] Pager [phone]  If 7PM-7AM, please contact night-coverage [email or web address] Password [email or web address] 10/07/2015, 6:04 AM

## 2015-10-07 NOTE — ED Notes (Signed)
CBG 570 - Dr Dina Rich states to maintain insulin gtt @ 5.4 ml/hr or 5.4 units per hour and recheck in one hour, Dr Dina Rich states to override Glucostabilizer at this time. Orders followed.

## 2015-10-07 NOTE — ED Provider Notes (Signed)
CSN: [id number]     Arrival date & time 10/07/15  [id number] History   First MD Initiated Contact with Patient 10/07/15 [phone]     Chief Complaint  Patient presents with  . Emesis     (Consider location/radiation/quality/duration/timing/severity/associated sxs/prior Treatment) HPI  This is a 54 year old female with a history of diabetes with an insulin pop, hypertension, GERD who presents with vomiting. Patient reports that she did not feel well yesterday morning. She ate breakfast and vomited once. She subsequently had further nausea throughout the afternoon and vomited one additional time. She denies any abdominal pain or diarrhea. She did not take her blood sugar during this time. At approximately 12:30 AM this morning, she developed persistent vomiting. Continues to deny abdominal pain, chest pain, shortness of breath. Denies recent infectious symptoms. Denies dysuria. She was concerned she may be in DKA. History of DKA with coma.  Has an insulin pump with a continuous infusion as well as bolus dosing with meals. States that she feels that her pump is working appropriately.  Past Medical History  Diagnosis Date  . Hypertension   . Sleep apnea sept 2014    borderline, no cpap needed  . Diabetes mellitus without complication ([place])   . Depression   . GERD (gastroesophageal reflux disease)     occasional  . Sciatic leg pain     right leg  . Blood clot in vein 1994     superficial after right ankle fracture  . PONV (postoperative nausea and vomiting)     vomiting after one surgery x 1   Past Surgical History  Procedure Laterality Date  . Rotator cuff repair Bilateral right 2004, left 1999  . Colonoscopy with propofol N/A 08/12/2014    Procedure: COLONOSCOPY WITH PROPOFOL;  Surgeon: Garlan Fair, MD;  Location: [facility];  Service: Endoscopy;  Laterality: N/A;   No family history on file. Social History  Substance Use Topics  . Smoking status: Current Every Day Smoker -- 0.50  packs/day for 30 years    Types: Cigarettes  . Smokeless tobacco: Never Used  . Alcohol Use: Yes     Comment: occasional   OB History    No data available     Review of Systems  Constitutional: Negative for fever.  Respiratory: Negative for chest tightness and shortness of breath.   Cardiovascular: Negative for chest pain.  Gastrointestinal: Positive for vomiting. Negative for nausea and abdominal pain.  Genitourinary: Negative for dysuria.  Neurological: Negative for headaches.  All other systems reviewed and are negative.     Allergies  Review of patient's allergies indicates no known allergies.  Home Medications   Prior to Admission medications   Medication Sig Start Date End Date Taking? Authorizing Provider  aspirin EC 81 MG tablet Take 81 mg by mouth daily.   Yes Historical Provider, MD  atorvastatin (LIPITOR) 10 MG tablet Take 10 mg by mouth every morning.   Yes Historical Provider, MD  insulin aspart (NOVOLOG) 100 UNIT/ML injection Inject 5-10 Units into the skin 3 (three) times daily before meals.   Yes Historical Provider, MD  insulin glargine (LANTUS) 100 UNIT/ML injection Inject 34 Units into the skin at bedtime.   Yes Historical Provider, MD  losartan (COZAAR) 25 MG tablet Take 25 mg by mouth every morning.   Yes Historical Provider, MD  Omeprazole (PRILOSEC PO) Take by mouth.   Yes Historical Provider, MD  sertraline (ZOLOFT) 100 MG tablet Take 100 mg by mouth every morning.  Yes Historical Provider, MD  triamterene-hydrochlorothiazide (MAXZIDE-25) 37.5-25 MG per tablet Take 0.5 tablets by mouth every morning.   Yes Historical Provider, MD  ranitidine (ZANTAC) 150 MG tablet Take 150 mg by mouth 2 (two) times daily.    Historical Provider, MD   BP 104/41 mmHg  Pulse 76  Resp 17  SpO2 97% Physical Exam  Constitutional: She is oriented to person, place, and time. She appears well-developed and well-nourished.  HENT:  Head: Normocephalic and atraumatic.   Mucous membranes dry  Cardiovascular: Normal rate and regular rhythm.   Murmur heard. Pulmonary/Chest: Effort normal and breath sounds normal. No respiratory distress. She has no wheezes.  Abdominal: Soft. Bowel sounds are normal. There is no tenderness. There is no rebound and no guarding.  Musculoskeletal: She exhibits no edema.  Neurological: She is alert and oriented to person, place, and time.  Skin: Skin is warm and dry.  Psychiatric: She has a normal mood and affect.  Nursing note and vitals reviewed.   ED Course  Procedures (including critical care time)  CRITICAL CARE Performed by: Merryl Hacker   Total critical care time: 45 minutes  Critical care time was exclusive of separately billable procedures and treating other patients.  Critical care was necessary to treat or prevent imminent or life-threatening deterioration.  Critical care was time spent personally by me on the following activities: development of treatment plan with patient and/or surrogate as well as nursing, discussions with consultants, evaluation of patient's response to treatment, examination of patient, obtaining history from patient or surrogate, ordering and performing treatments and interventions, ordering and review of laboratory studies, ordering and review of radiographic studies, pulse oximetry and re-evaluation of patient's condition.  Labs Review Labs Reviewed  CBC WITH DIFFERENTIAL/PLATELET - Abnormal; Notable for the following:    Neutro Abs 8.8 (*)    All other components within normal limits  COMPREHENSIVE METABOLIC PANEL - Abnormal; Notable for the following:    Chloride 98 (*)    CO2 14 (*)    Glucose, Bld 772 (*)    BUN 34 (*)    Creatinine, Ser 1.31 (*)    ALT 12 (*)    Total Bilirubin 1.7 (*)    GFR calc non Af Amer 45 (*)    GFR calc Af Amer 52 (*)    Anion gap 23 (*)    All other components within normal limits  CBG MONITORING, ED - Abnormal; Notable for the following:     Glucose-Capillary >600 (*)    All other components within normal limits  LIPASE, BLOOD  TROPONIN I  URINALYSIS, ROUTINE W REFLEX MICROSCOPIC (NOT AT [facility])    Imaging Review No results found. I have personally reviewed and evaluated these images and lab results as part of my medical decision-making.   EKG Interpretation   Date/Time:  Wednesday October 07 2015 05:04:26 EST Ventricular Rate:  65 PR Interval:  155 QRS Duration: 100 QT Interval:  473 QTC Calculation: 492 R Axis:   66 Text Interpretation:  Sinus rhythm Probable left atrial enlargement  Borderline prolonged QT interval Confirmed by HORTON  MD, COURTNEY ([id number])  on 10/07/2015 5:44:46 AM      MDM   Final diagnoses:  Diabetic ketoacidosis without coma associated with type 1 diabetes mellitus ([place])   Ptient presents with vomiting.  Initial glucose critically high on checked. Patient was given fluids, 10 units of insulin, and basic labwork was obtained. EKG is nonischemic. Patient's insulin pump was turned off.  Lab work notable for an anion gap with a glucose of 772, bicarbonate 14, and a creatinine of 1.3. LFTs and lipase are reassuring. Patient placed on the glucose stabilizer. A total of 2 L of fluid was ordered. Patient in apparent DKA. Suspect DKA may be driving the patient's vomiting.  Discussed with Dr. Blaine Hamper.  He has requested an additional 1 L bolus. Accepted to the step down unit.     Merryl Hacker, MD 10/07/15 [phone]

## 2015-10-07 NOTE — Progress Notes (Signed)
UR COMPLETED  

## 2015-10-08 DIAGNOSIS — I10 Essential (primary) hypertension: Secondary | ICD-10-CM

## 2015-10-08 DIAGNOSIS — E1310 Other specified diabetes mellitus with ketoacidosis without coma: Secondary | ICD-10-CM

## 2015-10-08 DIAGNOSIS — N179 Acute kidney failure, unspecified: Secondary | ICD-10-CM

## 2015-10-08 LAB — BASIC METABOLIC PANEL
Anion gap: 20 — ABNORMAL HIGH (ref 5–15)
Anion gap: 13 (ref 5–15)
Anion gap: 8 (ref 5–15)
Anion gap: 8 (ref 5–15)
BUN: 39 mg/dL — ABNORMAL HIGH (ref 6–20)
BUN: 38 mg/dL — ABNORMAL HIGH (ref 6–20)
BUN: 35 mg/dL — ABNORMAL HIGH (ref 6–20)
BUN: 33 mg/dL — ABNORMAL HIGH (ref 6–20)
CO2: 10 mmol/L — ABNORMAL LOW (ref 22–32)
CO2: 17 mmol/L — ABNORMAL LOW (ref 22–32)
CO2: 22 mmol/L (ref 22–32)
CO2: 23 mmol/L (ref 22–32)
Calcium: 8.4 mg/dL — ABNORMAL LOW (ref 8.9–10.3)
Calcium: 8.4 mg/dL — ABNORMAL LOW (ref 8.9–10.3)
Calcium: 8.7 mg/dL — ABNORMAL LOW (ref 8.9–10.3)
Calcium: 8.6 mg/dL — ABNORMAL LOW (ref 8.9–10.3)
Chloride: 102 mmol/L (ref 101–111)
Chloride: 106 mmol/L (ref 101–111)
Chloride: 108 mmol/L (ref 101–111)
Chloride: 105 mmol/L (ref 101–111)
Creatinine, Ser: 1.87 mg/dL — ABNORMAL HIGH (ref 0.44–1.00)
Creatinine, Ser: 1.69 mg/dL — ABNORMAL HIGH (ref 0.44–1.00)
Creatinine, Ser: 1.36 mg/dL — ABNORMAL HIGH (ref 0.44–1.00)
Creatinine, Ser: 1.31 mg/dL — ABNORMAL HIGH (ref 0.44–1.00)
GFR calc Af Amer: 34 mL/min — ABNORMAL LOW (ref >60)
GFR calc Af Amer: 38 mL/min — ABNORMAL LOW (ref >60)
GFR calc Af Amer: 49 mL/min — ABNORMAL LOW (ref >60)
GFR calc Af Amer: 52 mL/min — ABNORMAL LOW (ref >60)
GFR calc non Af Amer: 29 mL/min — ABNORMAL LOW (ref >60)
GFR calc non Af Amer: 33 mL/min — ABNORMAL LOW (ref >60)
GFR calc non Af Amer: 43 mL/min — ABNORMAL LOW (ref >60)
GFR calc non Af Amer: 45 mL/min — ABNORMAL LOW (ref >60)
Glucose, Bld: 556 mg/dL (ref 65–99)
Glucose, Bld: 315 mg/dL — ABNORMAL HIGH (ref 65–99)
Glucose, Bld: 171 mg/dL — ABNORMAL HIGH (ref 65–99)
Glucose, Bld: 153 mg/dL — ABNORMAL HIGH (ref 65–99)
Potassium: 4.4 mmol/L (ref 3.5–5.1)
Potassium: 3.7 mmol/L (ref 3.5–5.1)
Potassium: 3.8 mmol/L (ref 3.5–5.1)
Potassium: 3.8 mmol/L (ref 3.5–5.1)
Sodium: 132 mmol/L — ABNORMAL LOW (ref 135–145)
Sodium: 136 mmol/L (ref 135–145)
Sodium: 138 mmol/L (ref 135–145)
Sodium: 136 mmol/L (ref 135–145)

## 2015-10-08 LAB — HEPATIC FUNCTION PANEL
ALT: 13 U/L — ABNORMAL LOW (ref 14–54)
AST: 15 U/L (ref 15–41)
Albumin: 3 g/dL — ABNORMAL LOW (ref 3.5–5.0)
Alkaline Phosphatase: 68 U/L (ref 38–126)
Bilirubin, Direct: 0.1 mg/dL (ref 0.1–0.5)
Indirect Bilirubin: 0.7 mg/dL (ref 0.3–0.9)
Total Bilirubin: 0.8 mg/dL (ref 0.3–1.2)
Total Protein: 5.3 g/dL — ABNORMAL LOW (ref 6.5–8.1)

## 2015-10-08 LAB — CBC
HCT: 33.1 % — ABNORMAL LOW (ref 36.0–46.0)
HCT: 33.2 % — ABNORMAL LOW (ref 36.0–46.0)
Hemoglobin: 10.6 g/dL — ABNORMAL LOW (ref 12.0–15.0)
Hemoglobin: 10.7 g/dL — ABNORMAL LOW (ref 12.0–15.0)
MCH: 27.8 pg (ref 26.0–34.0)
MCH: 27.6 pg (ref 26.0–34.0)
MCHC: 32 g/dL (ref 30.0–36.0)
MCHC: 32.2 g/dL (ref 30.0–36.0)
MCV: 86.9 fL (ref 78.0–100.0)
MCV: 85.6 fL (ref 78.0–100.0)
Platelets: 170 10*3/uL (ref 150–400)
Platelets: 176 10*3/uL (ref 150–400)
RBC: 3.81 MIL/uL — ABNORMAL LOW (ref 3.87–5.11)
RBC: 3.88 MIL/uL (ref 3.87–5.11)
RDW: 15.4 % (ref 11.5–15.5)
RDW: 15.4 % (ref 11.5–15.5)
WBC: 16 10*3/uL — ABNORMAL HIGH (ref 4.0–10.5)
WBC: 12.9 10*3/uL — ABNORMAL HIGH (ref 4.0–10.5)

## 2015-10-08 LAB — GLUCOSE, CAPILLARY
Glucose-Capillary: 127 mg/dL — ABNORMAL HIGH (ref 65–99)
Glucose-Capillary: 129 mg/dL — ABNORMAL HIGH (ref 65–99)
Glucose-Capillary: 131 mg/dL — ABNORMAL HIGH (ref 65–99)
Glucose-Capillary: 154 mg/dL — ABNORMAL HIGH (ref 65–99)
Glucose-Capillary: 160 mg/dL — ABNORMAL HIGH (ref 65–99)
Glucose-Capillary: 214 mg/dL — ABNORMAL HIGH (ref 65–99)
Glucose-Capillary: 249 mg/dL — ABNORMAL HIGH (ref 65–99)
Glucose-Capillary: 319 mg/dL — ABNORMAL HIGH (ref 65–99)
Glucose-Capillary: 411 mg/dL — ABNORMAL HIGH (ref 65–99)
Glucose-Capillary: 448 mg/dL — ABNORMAL HIGH (ref 65–99)
Glucose-Capillary: 462 mg/dL — ABNORMAL HIGH (ref 65–99)
Glucose-Capillary: 95 mg/dL (ref 65–99)
Glucose-Capillary: 96 mg/dL (ref 65–99)

## 2015-10-08 MED ORDER — INSULIN ASPART 100 UNIT/ML ~~LOC~~ SOLN: 0.0000 [IU] | Freq: Every day | SUBCUTANEOUS | Status: DC

## 2015-10-08 MED ORDER — INSULIN ASPART 100 UNIT/ML ~~LOC~~ SOLN: 0.0000 [IU] | Freq: Three times a day (TID) | SUBCUTANEOUS | Status: DC

## 2015-10-08 MED ORDER — INSULIN ASPART 100 UNIT/ML ~~LOC~~ SOLN: 5.0000 [IU] | Freq: Three times a day (TID) | SUBCUTANEOUS | Status: DC

## 2015-10-08 MED ORDER — INSULIN GLARGINE 100 UNIT/ML ~~LOC~~ SOLN: 20.0000 [IU] | Freq: Every day | SUBCUTANEOUS | Status: DC

## 2015-10-08 MED ORDER — INSULIN ASPART 100 UNIT/ML ~~LOC~~ SOLN
5.0000 [IU] | Freq: Three times a day (TID) | SUBCUTANEOUS | Status: DC
  Administered 2015-10-08: 5 [IU] via SUBCUTANEOUS

## 2015-10-08 MED ORDER — INSULIN GLARGINE 100 UNIT/ML ~~LOC~~ SOLN
20.0000 [IU] | Freq: Every day | SUBCUTANEOUS | Status: DC
  Administered 2015-10-08: 20 [IU] via SUBCUTANEOUS
  Filled 2015-10-08: qty 0.2

## 2015-10-08 MED ORDER — PANTOPRAZOLE SODIUM 40 MG PO TBEC: 40.0000 mg | DELAYED_RELEASE_TABLET | Freq: Two times a day (BID) | ORAL | Status: DC

## 2015-10-08 NOTE — Progress Notes (Signed)
Discharge instructions reviewed with the patient. Medications were all reviewed. Pt was able to verbalize understanding. Pt shared about what foods she has at home that would be following her reccommended diet. Pt shared that she feels ready to go home and will be following up with her outpatient physicians if needed. Pt thanked staff for taking care of her during her stay here. Pt will be taken out to vehicle via wheelchair once she is dressed and ready.

## 2015-10-08 NOTE — Discharge Summary (Signed)
Physician Discharge Summary  Carrie Mcpherson [id number] DOB: Mar 25, 1961 DOA: 10/07/2015  PCP: Lottie Dawson, MD  Admit date: 10/07/2015 Discharge date: 10/08/2015  Time spent: 55 minutes    Discharge Condition: stable    Discharge Diagnoses:  Principal Problem:   DKA (diabetic ketoacidoses) ([place]) Active Problems:   Essential hypertension   GERD (gastroesophageal reflux disease)   Depression   AKI (acute kidney injury) ([place])   Acute pharyngitis   Tobacco abuse   History of present illness:  This is a 35 oral female with type 1 diabetes on an insulin pump, hypertension, depression who presented to [facility] med center with DKA. She admitted to having vomiting at home over the past 2 days prior to admission. She had a sore throat from the vomiting and some mild chills but no cough or congestion. No diarrhea.  Hospital Course:  DKA -Resolved with insulin infusion however once insulin pump was resumed, the patient went back into DKA and therefore insulin infusion resumed -I asked the diabetes coordinator to check the pump and it has been noted that the cannula is bent and therefore the patient was not receiving any insulin -have given her Lantus today once DKA resolved- she can give herself NovoLog with meals-will go home, change pump site and resume pump tomorrow  AKI -2 due to dehydration-Creatinine improving with IV fluids  HTN - cont to hold Losartan and Maxzide for 2 more days due to AKI    Discharge Exam: Filed Weights   10/07/15 0745  Weight: 86.8 kg (191 lb 5.8 oz)   Filed Vitals:   10/08/15 0800 10/08/15 1200  BP:    Pulse:    Temp: 97.8 F (36.6 C) 97.7 F (36.5 C)  Resp:      General: AAO x 3, no distress Cardiovascular: RRR, no murmurs  Respiratory: clear to auscultation bilaterally GI: soft, non-tender, non-distended, bowel sound positive  Discharge Instructions You were cared for by a hospitalist during your hospital stay. If  you have any questions about your discharge medications or the care you received while you were in the hospital after you are discharged, you can call the unit and asked to speak with the hospitalist on call if the hospitalist that took care of you is not available. Once you are discharged, your primary care physician will handle any further medical issues. Please note that NO REFILLS for any discharge medications will be authorized once you are discharged, as it is imperative that you return to your primary care physician (or establish a relationship with a primary care physician if you do not have one) for your aftercare needs so that they can reassess your need for medications and monitor your lab values.      Discharge Instructions    Discharge instructions    Complete by:  As directed   Do not take Losartan and Maxzide for another 2 days - Diabetic diet, low fat and low sodium     Increase activity slowly    Complete by:  As directed             Medication List    STOP taking these medications        losartan 25 MG tablet  Commonly known as:  COZAAR     triamterene-hydrochlorothiazide 37.5-25 MG tablet  Commonly known as:  MAXZIDE-25      TAKE these medications        aspirin EC 81 MG tablet  Take 81 mg by mouth daily.  atorvastatin 10 MG tablet  Commonly known as:  LIPITOR  Take 10 mg by mouth every morning.     insulin aspart 100 UNIT/ML injection  Commonly known as:  novoLOG  Inject 0-9 Units into the skin 3 (three) times daily with meals.     insulin aspart 100 UNIT/ML injection  Commonly known as:  novoLOG  Inject 5 Units into the skin 3 (three) times daily with meals.     insulin glargine 100 UNIT/ML injection  Commonly known as:  LANTUS  Inject 0.2 mLs (20 Units total) into the skin daily.     omeprazole 40 MG capsule  Commonly known as:  PRILOSEC  Take 40 mg by mouth daily.     sertraline 100 MG tablet  Commonly known as:  ZOLOFT  Take 150 mg by  mouth every morning.       No Known Allergies    The results of significant diagnostics from this hospitalization (including imaging, microbiology, ancillary and laboratory) are listed below for reference.    Significant Diagnostic Studies: Dg Abd Acute W/chest  10/07/2015  CLINICAL DATA:  Acute onset of nausea and vomiting. Hyperglycemia. Initial encounter. EXAM: DG ABDOMEN ACUTE W/ 1V CHEST COMPARISON:  None. FINDINGS: The lungs are well-aerated. Minimal bibasilar atelectasis is noted. There is no evidence of pleural effusion or pneumothorax. The cardiomediastinal silhouette is within normal limits. The visualized bowel gas pattern is unremarkable. Scattered stool and air are seen within the colon; there is no evidence of small bowel dilatation to suggest obstruction. No free intra-abdominal air is identified on the provided upright view. A small amount of air and fluid is seen within the stomach. No acute osseous abnormalities are seen; the sacroiliac joints are unremarkable in appearance. IMPRESSION: 1. Unremarkable bowel gas pattern; no free intra-abdominal air seen. Small amount of stool noted in the colon. 2. Minimal bibasilar atelectasis noted. Electronically Signed   By: Garald Balding M.D.   On: 10/07/2015 06:44    Microbiology: Recent Results (from the past 240 hour(s))  MRSA PCR Screening     Status: None   Collection Time: 10/07/15 10:18 AM  Result Value Ref Range Status   MRSA by PCR NEGATIVE NEGATIVE Final    Comment:        The GeneXpert MRSA Assay (FDA approved for NASAL specimens only), is one component of a comprehensive MRSA colonization surveillance program. It is not intended to diagnose MRSA infection nor to guide or monitor treatment for MRSA infections.   Rapid strep screen (not at [facility])     Status: None   Collection Time: 10/07/15 12:39 PM  Result Value Ref Range Status   Streptococcus, Group A Screen (Direct) NEGATIVE NEGATIVE Final    Comment:  (NOTE) A Rapid Antigen test may result negative if the antigen level in the sample is below the detection level of this test. The FDA has not cleared this test as a stand-alone test therefore the rapid antigen negative result has reflexed to a Group A Strep culture.      Labs: Basic Metabolic Panel:  Recent Labs Lab 10/07/15 1141 10/08/15 0015 10/08/15 0431 10/08/15 0740 10/08/15 1120  NA 143 132* 136 138 136  K 4.3 4.4 3.7 3.8 3.8  CL 112* 102 106 108 105  CO2 24 10* 17* 22 23  GLUCOSE 266* 556* 315* 171* 153*  BUN 27* 39* 38* 35* 33*  CREATININE 1.17* 1.87* 1.69* 1.36* 1.31*  CALCIUM 8.7* 8.4* 8.4* 8.7* 8.6*   Liver Function  Tests:  Recent Labs Lab 10/07/15 0500 10/08/15 0431  AST 22 15  ALT 12* 13*  ALKPHOS 116 68  BILITOT 1.7* 0.8  PROT 7.6 5.3*  ALBUMIN 4.5 3.0*    Recent Labs Lab 10/07/15 0500  LIPASE 19   No results for input(s): AMMONIA in the last 168 hours. CBC:  Recent Labs Lab 10/07/15 0500 10/08/15 0431 10/08/15 1120  WBC 10.0 16.0* 12.9*  NEUTROABS 8.8*  --   --   HGB 13.1 10.6* 10.7*  HCT 40.0 33.1* 33.2*  MCV 84.9 86.9 85.6  PLT 219 170 176   Cardiac Enzymes:  Recent Labs Lab 10/07/15 0500  TROPONINI <0.03   BNP: BNP (last 3 results) No results for input(s): BNP in the last 8760 hours.  ProBNP (last 3 results) No results for input(s): PROBNP in the last 8760 hours.  CBG:  Recent Labs Lab 10/08/15 0828 10/08/15 0943 10/08/15 1039 10/08/15 1126 10/08/15 1234  GLUCAP 129* 127* 154* 131* 96       Signed:  Debbe Odea, MD [facility] 10/08/2015, 3:33 PM

## 2015-10-08 NOTE — Progress Notes (Signed)
Reviewed with patient plans for Lantus/Novolog.  She can bolus with Novolog for supper and correction until she restarts insulin pump on 12/16.  Plan is to resume insulin pump around 10 am.  Reviewed trouble shooting of insulin pump including site, insulin, pump and lifestyle.  Handout given from [employer] site regarding trouble-shooting. Patient verbalized understanding and states she will use insulin pump settings to calculate bolus amounts.   States that her mother and cousin are coming this afternoon. She is asking for a note for work to be off til Monday, December 19.  Patient was able to teach back information.   Thanks, Adah Perl, RN, BC-ADM Inpatient Diabetes Coordinator Pager [phone] (8a-5p)

## 2015-10-08 NOTE — Progress Notes (Addendum)
Inpatient Diabetes Program Recommendations  AACE/ADA: New Consensus Statement on Inpatient Glycemic Control (2015)  Target Ranges:  Prepandial:   less than 140 mg/dL      Peak postprandial:   less than 180 mg/dL (1-2 hours)      Critically ill patients:  140 - 180 mg/dL   Diabetes history: Type 1 diabetes since 1999-See's Dr. Buddy Duty Outpatient Diabetes medications: T-Slim insulin pump Basal-0.8 units/hr Correction:  1 unit/50 grams CHO CHO coverage:  1 unit/7 grams of CHO Target- 100 mg/dL  Current orders for Inpatient glycemic control: Patient is currently on insulin drip.  Inpatient Diabetes Program Recommendations: Note patient transitioned to insulin pump yesterday off insulin drip however CBG's went back up and insulin drip had to be restarted.  Patient now ready to transition off insulin drip again.  Call received from Dr. Wynelle Cleveland regarding transition.  Saw patient and assessed insulin pump site as patient restarted insulin pump yesterday.  Patient reports that she had just restarted insulin pump site on Tuesday morning prior to DKA occuring. Removed insulin pump site and found bent cannula which explains DKA occurrence and re-occurence.  She does not have any insulin pump supplies and states that she does not have anyone who can bring them to her. Called and spoke with Dr. Wynelle Cleveland regarding transition plans.  She states she will start Lantus 20 units 1-2 hrs prior to d/c of insulin drip along with Novolog correction/meal coverage.   Patient will likely d/c today.  She can resume insulin pump tomorrow morning.   Thanks, Adah Perl, RN, BC-ADM Inpatient Diabetes Coordinator Pager [phone] (8a-5p)

## 2015-10-09 LAB — CULTURE, GROUP A STREP: Strep A Culture: NEGATIVE

## 2015-12-09 ENCOUNTER — Ambulatory Visit: Payer: Managed Care, Other (non HMO) | Admitting: *Deleted

## 2015-12-15 ENCOUNTER — Encounter: Payer: Managed Care, Other (non HMO) | Attending: Internal Medicine | Admitting: *Deleted

## 2015-12-15 DIAGNOSIS — E108 Type 1 diabetes mellitus with unspecified complications: Secondary | ICD-10-CM

## 2015-12-15 DIAGNOSIS — E1065 Type 1 diabetes mellitus with hyperglycemia: Secondary | ICD-10-CM | POA: Diagnosis present

## 2015-12-15 DIAGNOSIS — IMO0002 Reserved for concepts with insufficient information to code with codable children: Secondary | ICD-10-CM

## 2015-12-18 ENCOUNTER — Encounter: Payer: Self-pay | Admitting: *Deleted

## 2015-12-18 NOTE — Progress Notes (Signed)
Diabetes Self-Management Education  Visit Type: First/Initial  Appt. Start Time: 1400 Appt. End Time: 1500  12/18/2015  Ms. [facility], identified by name and date of birth, is a 55 y.o. female with a diagnosis of Diabetes: Type 1. Patient does not have her CGM yet, she would like more education on use of CGM and what it's benefits could be.  ASSESSMENT  There were no vitals taken for this visit. There is no weight on file to calculate BMI.      Diabetes Self-Management Education - 12/18/15 1451    Visit Information   Visit Type First/Initial   Initial Visit   Diabetes Type Type 1   Are you currently following a meal plan? Yes   What type of meal plan do you follow? carb counting   Date Diagnosed [place]   How would you rate your overall health? Good   Psychosocial Assessment   Patient Belief/Attitude about Diabetes Motivated to manage diabetes  sometimes discouraged   Self-care barriers None   Self-management support Doctor's office;CDE visits   Other persons present Patient   Patient Concerns Glycemic Control;Monitoring  Information on CGM   Special Needs None   What is the last grade level you completed in school? [id number] grade   Complications   Last HgB A1C per patient/outside source 8.5 %   How often do you check your blood sugar? 3-4 times/day   Have you had a dilated eye exam in the past 12 months? Yes   Have you had a dental exam in the past 12 months? No   Are you checking your feet? Yes   How many days per week are you checking your feet? 4   Exercise   Exercise Type Light (walking / raking leaves)   How many days per week to you exercise? 2   How many minutes per day do you exercise? 30   Total minutes per week of exercise 60   Patient Education   Previous Diabetes Education Yes (please comment)  1999   Monitoring --  Discussed options for CGM, pt interested in Dexcom as she is on T Slim insulin pump   Individualized Goals (developed by  patient)   Monitoring  Other (comment)  Reviewed CGM pros and cons, calibration, alerts and warranty   Outcomes   Expected Outcomes Demonstrated interest in learning. Expect positive outcomes   Future DMSE PRN   Program Status Not Completed   Subsequent Visit   Since your last visit have you continued or begun to take your medications as prescribed? Yes   Since your last visit, are you checking your blood glucose at least once a day? Yes      Individualized Plan for Diabetes Self-Management Training:   Learning Objective:  Patient will have a greater understanding of diabetes self-management. Patient education plan is to attend individual and/or group sessions per assessed needs and concerns.  Intervention:  Reviewed the following: Understanding Glucose Sensing Programming Sensor Alerts Entering BG, Calibration Technique and Graphs with Arrows   Expected Outcomes:  Demonstrated interest in learning. Expect positive outcomes  Education material provided: Dexcom information, DM 1 Support Group handout  If problems or questions, patient to contact team via:  Phone and Email  Future DSME appointment: PRN, patient chooses to contact Dexcom Rep herself to pursue CGM. She is to contact this office when it ships to set up training

## 2016-02-08 ENCOUNTER — Other Ambulatory Visit: Payer: Self-pay | Admitting: Family Medicine

## 2016-02-08 ENCOUNTER — Other Ambulatory Visit: Payer: Self-pay | Admitting: Internal Medicine

## 2016-02-08 DIAGNOSIS — R112 Nausea with vomiting, unspecified: Secondary | ICD-10-CM

## 2016-02-17 ENCOUNTER — Ambulatory Visit
Admission: RE | Admit: 2016-02-17 | Discharge: 2016-02-17 | Disposition: A | Discharge status: Home / Self Care | Payer: Managed Care, Other (non HMO) | Source: Ambulatory Visit | Attending: Family Medicine | Admitting: Family Medicine

## 2016-02-17 ENCOUNTER — Other Ambulatory Visit: Payer: Managed Care, Other (non HMO)

## 2016-02-17 DIAGNOSIS — R112 Nausea with vomiting, unspecified: Secondary | ICD-10-CM

## 2016-04-25 ENCOUNTER — Other Ambulatory Visit: Payer: Self-pay | Admitting: Internal Medicine

## 2016-04-25 DIAGNOSIS — Z1231 Encounter for screening mammogram for malignant neoplasm of breast: Secondary | ICD-10-CM

## 2016-05-06 ENCOUNTER — Ambulatory Visit
Admission: RE | Admit: 2016-05-06 | Discharge: 2016-05-06 | Disposition: A | Discharge status: Home / Self Care | Payer: Managed Care, Other (non HMO) | Source: Ambulatory Visit | Attending: Internal Medicine | Admitting: Internal Medicine

## 2016-05-06 DIAGNOSIS — Z1231 Encounter for screening mammogram for malignant neoplasm of breast: Secondary | ICD-10-CM

## 2017-01-16 ENCOUNTER — Other Ambulatory Visit (HOSPITAL_COMMUNITY)
Admission: RE | Admit: 2017-01-16 | Discharge: 2017-01-16 | Disposition: A | Discharge status: Home / Self Care | Payer: Managed Care, Other (non HMO) | Source: Ambulatory Visit | Attending: Internal Medicine | Admitting: Internal Medicine

## 2017-01-16 ENCOUNTER — Other Ambulatory Visit: Payer: Self-pay | Admitting: Internal Medicine

## 2017-01-16 DIAGNOSIS — Z01419 Encounter for gynecological examination (general) (routine) without abnormal findings: Secondary | ICD-10-CM | POA: Insufficient documentation

## 2017-01-16 DIAGNOSIS — Z1151 Encounter for screening for human papillomavirus (HPV): Secondary | ICD-10-CM | POA: Diagnosis present

## 2017-01-19 LAB — CYTOLOGY - PAP
Adequacy: ABSENT
Diagnosis: NEGATIVE
HPV: NOT DETECTED

## 2017-07-16 ENCOUNTER — Ambulatory Visit (HOSPITAL_COMMUNITY)
Admission: EM | Admit: 2017-07-16 | Discharge: 2017-07-16 | Disposition: A | Discharge status: Home / Self Care | Payer: Managed Care, Other (non HMO) | Attending: Family Medicine | Admitting: Family Medicine

## 2017-07-16 ENCOUNTER — Encounter (HOSPITAL_COMMUNITY): Payer: Self-pay | Admitting: Family Medicine

## 2017-07-16 DIAGNOSIS — J340 Abscess, furuncle and carbuncle of nose: Secondary | ICD-10-CM

## 2017-07-16 MED ORDER — SULFAMETHOXAZOLE-TRIMETHOPRIM 800-160 MG PO TABS: 1.0000 | ORAL_TABLET | Freq: Two times a day (BID) | ORAL | 0 refills | Status: AC

## 2017-07-16 MED ORDER — MUPIROCIN 2 % EX OINT: 1.0000 "application " | TOPICAL_OINTMENT | Freq: Three times a day (TID) | CUTANEOUS | 1 refills | Status: DC

## 2017-07-16 NOTE — Discharge Instructions (Signed)
Warm moist compresses to the lateral aspect of the left nose will help as well

## 2017-07-16 NOTE — ED Provider Notes (Signed)
[place]   [id number] 07/16/17 Arrival Time: 1622   SUBJECTIVE:  Carrie Mcpherson is a 56 y.o. female who presents to the urgent care with complaint of left nostril pain and swelling.  She had a similar problem 6 months ago which was diagnosed as a MRSA abscess that took 3 weeks to heal.  She's already started using her Bactroban ointment. She has clear discharge along with a swelling along the lateral aspect of the left nose.  Patient works for [employer] and last Friday was exposed to large amount of construction dust that developed from renovation  No fever or epistaxis  Past Medical History:  Diagnosis Date  . Blood clot in vein 1994    superficial after right ankle fracture  . Depression   . Diabetes mellitus without complication ([place])   . GERD (gastroesophageal reflux disease)    occasional  . Hypertension   . PONV (postoperative nausea and vomiting)    vomiting after one surgery x 1  . Sciatic leg pain    right leg  . Sleep apnea sept 2014   borderline, no cpap needed   History reviewed. No pertinent family history. Social History   Social History  . Marital status: Single    Spouse name: N/A  . Number of children: N/A  . Years of education: N/A   Occupational History  . Not on file.   Social History Main Topics  . Smoking status: Current Every Day Smoker    Packs/day: 0.50    Years: 30.00    Types: Cigarettes  . Smokeless tobacco: Never Used  . Alcohol use Yes     Comment: occasional  . Drug use: No  . Sexual activity: Not on file   Other Topics Concern  . Not on file   Social History Narrative  . No narrative on file   Current Meds  Medication Sig  . aspirin EC 81 MG tablet Take 81 mg by mouth daily.  Marland Kitchen atorvastatin (LIPITOR) 10 MG tablet Take 10 mg by mouth every morning.  . insulin aspart (NOVOLOG) 100 UNIT/ML injection Inject 0-9 Units into the skin 3 (three) times daily with meals.  . insulin aspart (NOVOLOG) 100 UNIT/ML  injection Inject 5 Units into the skin 3 (three) times daily with meals.  . insulin glargine (LANTUS) 100 UNIT/ML injection Inject 0.2 mLs (20 Units total) into the skin daily.  . insulin lispro (HUMALOG) 100 UNIT/ML injection Inject 0-9 Units into the skin 3 (three) times daily before meals.  Marland Kitchen losartan (COZAAR) 25 MG tablet Take 25 mg by mouth daily.  . Multiple Vitamins-Minerals (MULTIVITAMIN WITH MINERALS) tablet Take 1 tablet by mouth daily.  Marland Kitchen omeprazole (PRILOSEC) 40 MG capsule Take 40 mg by mouth daily.  . sertraline (ZOLOFT) 100 MG tablet Take 150 mg by mouth every morning.    No Known Allergies    ROS: As per HPI, remainder of ROS negative.   OBJECTIVE:   Vitals:   07/16/17 1657  BP: (!) 153/82  Pulse: 71  Resp: 18  Temp: 98.2 F (36.8 C)  TempSrc: Oral  SpO2: 98%     General appearance: alert; no distress Eyes: PERRL; EOMI; conjunctiva normal HENT: normocephalic; atraumatic; TMs normal, canal normal, external ears normal without trauma; nasal mucosa normal with some lateral swelling in left nostril; oral mucosa normal Neck: supple Back: no CVA tenderness Extremities: no cyanosis or edema; symmetrical with no gross deformities Skin: warm and dry Neurologic: normal gait; grossly normal  Psychological: alert and cooperative; normal mood and affect      Labs:  Results for orders placed or performed in visit on 01/16/17  Cytology - PAP  Result Value Ref Range   Adequacy      Satisfactory for evaluation  endocervical/transformation zone component ABSENT.   Diagnosis      NEGATIVE FOR INTRAEPITHELIAL LESIONS OR MALIGNANCY.   HPV NOT DETECTED    Material Submitted CervicoVaginal Pap [ThinPrep Imaged]    CYTOLOGY - PAP PAP RESULT     Labs Reviewed - No data to display  No results found.     ASSESSMENT & PLAN:  1. Nasal abscess     Meds ordered this encounter  Medications  . sulfamethoxazole-trimethoprim (BACTRIM DS,SEPTRA DS) 800-160 MG tablet      Sig: Take 1 tablet by mouth 2 (two) times daily.    Dispense:  20 tablet    Refill:  0  . mupirocin ointment (BACTROBAN) 2 %    Sig: Apply 1 application topically 3 (three) times daily.    Dispense:  22 g    Refill:  1    Reviewed expectations re: course of current medical issues. Questions answered. Outlined signs and symptoms indicating need for more acute intervention. Patient verbalized understanding. After Visit Summary given.       Robyn Haber, MD 07/16/17 [phone]

## 2017-07-16 NOTE — ED Triage Notes (Signed)
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 NAD... Ambulatory

## 2017-09-04 DIAGNOSIS — Z79899 Other long term (current) drug therapy: Secondary | ICD-10-CM | POA: Diagnosis not present

## 2017-09-04 DIAGNOSIS — Z4681 Encounter for fitting and adjustment of insulin pump: Secondary | ICD-10-CM | POA: Diagnosis not present

## 2017-09-04 DIAGNOSIS — F329 Major depressive disorder, single episode, unspecified: Secondary | ICD-10-CM | POA: Insufficient documentation

## 2017-09-04 DIAGNOSIS — K219 Gastro-esophageal reflux disease without esophagitis: Secondary | ICD-10-CM | POA: Insufficient documentation

## 2017-09-04 DIAGNOSIS — Z7982 Long term (current) use of aspirin: Secondary | ICD-10-CM | POA: Diagnosis not present

## 2017-09-04 DIAGNOSIS — Z794 Long term (current) use of insulin: Secondary | ICD-10-CM | POA: Diagnosis not present

## 2017-09-04 DIAGNOSIS — I10 Essential (primary) hypertension: Secondary | ICD-10-CM | POA: Insufficient documentation

## 2017-09-04 DIAGNOSIS — Z882 Allergy status to sulfonamides status: Secondary | ICD-10-CM | POA: Diagnosis not present

## 2017-09-04 DIAGNOSIS — Z86718 Personal history of other venous thrombosis and embolism: Secondary | ICD-10-CM | POA: Insufficient documentation

## 2017-09-04 DIAGNOSIS — E1065 Type 1 diabetes mellitus with hyperglycemia: Secondary | ICD-10-CM | POA: Diagnosis present

## 2017-09-04 DIAGNOSIS — F17210 Nicotine dependence, cigarettes, uncomplicated: Secondary | ICD-10-CM | POA: Insufficient documentation

## 2017-09-04 DIAGNOSIS — E1010 Type 1 diabetes mellitus with ketoacidosis without coma: Principal | ICD-10-CM | POA: Insufficient documentation

## 2017-09-05 ENCOUNTER — Observation Stay (HOSPITAL_COMMUNITY)
Admission: EM | Admit: 2017-09-05 | Discharge: 2017-09-05 | Disposition: A | Discharge status: Home / Self Care | Payer: Managed Care, Other (non HMO) | Attending: Internal Medicine | Admitting: Internal Medicine

## 2017-09-05 ENCOUNTER — Encounter (HOSPITAL_COMMUNITY): Payer: Self-pay | Admitting: Emergency Medicine

## 2017-09-05 DIAGNOSIS — I10 Essential (primary) hypertension: Secondary | ICD-10-CM | POA: Diagnosis present

## 2017-09-05 DIAGNOSIS — E1010 Type 1 diabetes mellitus with ketoacidosis without coma: Secondary | ICD-10-CM | POA: Diagnosis not present

## 2017-09-05 DIAGNOSIS — E1110 Type 2 diabetes mellitus with ketoacidosis without coma: Secondary | ICD-10-CM | POA: Diagnosis present

## 2017-09-05 LAB — CBG MONITORING, ED
Glucose-Capillary: 232 mg/dL — ABNORMAL HIGH (ref 65–99)
Glucose-Capillary: 282 mg/dL — ABNORMAL HIGH (ref 65–99)
Glucose-Capillary: 359 mg/dL — ABNORMAL HIGH (ref 65–99)
Glucose-Capillary: 406 mg/dL — ABNORMAL HIGH (ref 65–99)
Glucose-Capillary: 581 mg/dL (ref 65–99)

## 2017-09-05 LAB — CBC
HCT: 35.3 % — ABNORMAL LOW (ref 36.0–46.0)
HCT: 38.4 % (ref 36.0–46.0)
Hemoglobin: 12 g/dL (ref 12.0–15.0)
Hemoglobin: 13 g/dL (ref 12.0–15.0)
MCH: 28.2 pg (ref 26.0–34.0)
MCH: 28.3 pg (ref 26.0–34.0)
MCHC: 33.9 g/dL (ref 30.0–36.0)
MCHC: 34 g/dL (ref 30.0–36.0)
MCV: 82.9 fL (ref 78.0–100.0)
MCV: 83.5 fL (ref 78.0–100.0)
Platelets: 184 10*3/uL (ref 150–400)
Platelets: 193 10*3/uL (ref 150–400)
RBC: 4.26 MIL/uL (ref 3.87–5.11)
RBC: 4.6 MIL/uL (ref 3.87–5.11)
RDW: 13.4 % (ref 11.5–15.5)
RDW: 13.5 % (ref 11.5–15.5)
WBC: 6.8 10*3/uL (ref 4.0–10.5)
WBC: 7.3 10*3/uL (ref 4.0–10.5)

## 2017-09-05 LAB — BASIC METABOLIC PANEL
Anion gap: 17 — ABNORMAL HIGH (ref 5–15)
Anion gap: 9 (ref 5–15)
BUN: 29 mg/dL — ABNORMAL HIGH (ref 6–20)
BUN: 24 mg/dL — ABNORMAL HIGH (ref 6–20)
CO2: 20 mmol/L — ABNORMAL LOW (ref 22–32)
CO2: 25 mmol/L (ref 22–32)
Calcium: 9.1 mg/dL (ref 8.9–10.3)
Calcium: 8.7 mg/dL — ABNORMAL LOW (ref 8.9–10.3)
Chloride: 94 mmol/L — ABNORMAL LOW (ref 101–111)
Chloride: 99 mmol/L — ABNORMAL LOW (ref 101–111)
Creatinine, Ser: 1.12 mg/dL — ABNORMAL HIGH (ref 0.44–1.00)
Creatinine, Ser: 0.77 mg/dL (ref 0.44–1.00)
GFR calc Af Amer: >60 mL/min (ref >60)
GFR calc Af Amer: >60 mL/min (ref >60)
GFR calc non Af Amer: 53 mL/min — ABNORMAL LOW (ref >60)
GFR calc non Af Amer: >60 mL/min (ref >60)
Glucose, Bld: 519 mg/dL (ref 65–99)
Glucose, Bld: 241 mg/dL — ABNORMAL HIGH (ref 65–99)
Potassium: 3.8 mmol/L (ref 3.5–5.1)
Potassium: 3.8 mmol/L (ref 3.5–5.1)
Sodium: 131 mmol/L — ABNORMAL LOW (ref 135–145)
Sodium: 133 mmol/L — ABNORMAL LOW (ref 135–145)

## 2017-09-05 LAB — HIV ANTIBODY (ROUTINE TESTING W REFLEX): HIV Screen 4th Generation wRfx: NONREACTIVE

## 2017-09-05 LAB — URINALYSIS, ROUTINE W REFLEX MICROSCOPIC
Bacteria, UA: NONE SEEN
Bilirubin Urine: NEGATIVE
Glucose, UA: >=500 mg/dL — AB
Ketones, ur: 80 mg/dL — AB
Leukocytes, UA: NEGATIVE
Nitrite: NEGATIVE
Protein, ur: NEGATIVE mg/dL
Specific Gravity, Urine: 1.025 (ref 1.005–1.030)
pH: 5 (ref 5.0–8.0)

## 2017-09-05 MED ORDER — SODIUM CHLORIDE 0.9 % IV SOLN: INTRAVENOUS | Status: DC

## 2017-09-05 MED ORDER — ASPIRIN EC 81 MG PO TBEC: 81.0000 mg | DELAYED_RELEASE_TABLET | Freq: Every day | ORAL | Status: DC

## 2017-09-05 MED ORDER — ENOXAPARIN SODIUM 40 MG/0.4ML ~~LOC~~ SOLN: 40.0000 mg | SUBCUTANEOUS | Status: DC

## 2017-09-05 MED ORDER — SODIUM CHLORIDE 0.9 % IV BOLUS (SEPSIS)
1000.0000 mL | Freq: Once | INTRAVENOUS | Status: AC
  Administered 2017-09-05: 1000 mL via INTRAVENOUS

## 2017-09-05 MED ORDER — HYDRALAZINE HCL 20 MG/ML IJ SOLN: 10.0000 mg | INTRAMUSCULAR | Status: DC | PRN

## 2017-09-05 MED ORDER — SERTRALINE HCL 50 MG PO TABS: 150.0000 mg | ORAL_TABLET | Freq: Every morning | ORAL | Status: DC

## 2017-09-05 MED ORDER — SODIUM CHLORIDE 0.9 % IV SOLN
INTRAVENOUS | Status: DC
  Filled 2017-09-05: qty 1

## 2017-09-05 MED ORDER — DEXTROSE-NACL 5-0.45 % IV SOLN: INTRAVENOUS | Status: DC

## 2017-09-05 MED ORDER — ATORVASTATIN CALCIUM 10 MG PO TABS: 10.0000 mg | ORAL_TABLET | ORAL | Status: DC

## 2017-09-05 NOTE — ED Triage Notes (Signed)
Pt is a type I diabetic and comes in with complaints of emesis and feeling "yucky" after realizing her blood sugar was > 600.  Pt has an insulin pump that was on at the time but it began beeping stating it was "clogged".  Pt states it showed she only received 3 u of insulin.  After noting the >600 blood sugar patient took 25 u of humalog and 25 u of lantus.  Has had more than one episode of emesis.  Pt ambulatory and A&O x4. Pt turned insulin pump off and it is not connected at this time.

## 2017-09-05 NOTE — Discharge Instructions (Signed)
Do not restart your insulin pump until 11 PM tonight.  Continue giving yourself Humalog with each meal.  Be sure to check your sugars before each meal to ensure that you are not at risk for dropping your sugar too low after administering insulin.  Continue to remain well hydrated by drinking plenty of water.  Return to the emergency department for new or concerning symptoms.

## 2017-09-05 NOTE — Progress Notes (Signed)
Attempted to call ED RN, was unable to reach. Notified MD regarding Pts last blood sugar, and verbal orders given for STAT BMET. Transfer pending results of BMET.

## 2017-09-05 NOTE — ED Provider Notes (Addendum)
[facility] DEPT Provider Note   CSN: [id number] Arrival date & time: 09/04/17  2359     History   Chief Complaint Chief Complaint  Patient presents with  . Hyperglycemia    HPI Carrie Mcpherson is a 56 y.o. female.  56 year old female with a history of diabetes presents to the emergency department for increased nausea and vomiting.  She reports feeling "yucky" at which time she checked her blood sugar and realized it was over 600.  She tried drinking water which helped her symptoms slightly, but she began to notice worsening of her symptoms again at 2200.  She had 3 episodes of vomiting at this time and reports persistent hyperglycemia greater than 600.  She was able to look at her pump and realized that she only received 3 units of insulin today due to a clogged in her tubing.  This issue has resolved.  She usually receives 10-15 units of insulin every 4 hours.  She self administered 25 units of Humalog and 25 units of Lantus prior to arrival.  She is not currently complaining of nausea.  She does continue to have polyuria and polydipsia.  No recent fevers. Hx of DKA in 2017.      Past Medical History:  Diagnosis Date  . Blood clot in vein 1994    superficial after right ankle fracture  . Depression   . Diabetes mellitus without complication ([place])   . GERD (gastroesophageal reflux disease)    occasional  . Hypertension   . PONV (postoperative nausea and vomiting)    vomiting after one surgery x 1  . Sciatic leg pain    right leg  . Sleep apnea sept 2014   borderline, no cpap needed    Patient Active Problem List   Diagnosis Date Noted  . DKA (diabetic ketoacidoses) ([place]) 10/07/2015  . Essential hypertension 10/07/2015  . GERD (gastroesophageal reflux disease) 10/07/2015  . Depression 10/07/2015  . AKI (acute kidney injury) ([place]) 10/07/2015  . Acute pharyngitis 10/07/2015  . Tobacco abuse 10/07/2015    Past Surgical History:    Procedure Laterality Date  . ROTATOR CUFF REPAIR Bilateral right 2004, left 1999    OB History    No data available       Home Medications    Prior to Admission medications   Medication Sig Start Date End Date Taking? Authorizing Provider  aspirin EC 81 MG tablet Take 81 mg by mouth daily.   Yes [provider]  atorvastatin (LIPITOR) 10 MG tablet Take 10 mg by mouth every morning.   Yes [provider]  insulin glargine (LANTUS) 100 UNIT/ML injection Inject 0.2 mLs (20 Units total) into the skin daily. 10/08/15  Yes Debbe Odea, MD  insulin lispro (HUMALOG) 100 UNIT/ML injection Inject 0-9 Units 3 (three) times daily before meals into the skin. Per insulin pump   Yes [provider]  losartan (COZAAR) 25 MG tablet Take 25 mg by mouth daily.   Yes [provider]  Multiple Vitamins-Minerals (MULTIVITAMIN WITH MINERALS) tablet Take 1 tablet by mouth daily.   Yes [provider]  omeprazole (PRILOSEC) 40 MG capsule Take 40 mg by mouth daily. 10/04/15  Yes [provider]  sertraline (ZOLOFT) 100 MG tablet Take 150 mg by mouth every morning.    Yes [provider]  triamterene-hydrochlorothiazide (MAXZIDE-25) 37.5-25 MG tablet Take 1 tablet daily by mouth. 08/14/17  Yes [provider]    Family History Family History  Problem Relation Age of Onset  . Hypertension Other     Social History Social History   Tobacco Use  . Smoking status: Current Every Day Smoker    Packs/day: 0.50    Years: 30.00    Pack years: 15.00    Types: Cigarettes  . Smokeless tobacco: Never Used  Substance Use Topics  . Alcohol use: Yes    Comment: occasional  . Drug use: No     Allergies   Bactrim [sulfamethoxazole-trimethoprim]   Review of Systems Review of Systems Ten systems reviewed and are negative for acute change, except as noted in the HPI.    Physical Exam Updated Vital Signs BP (!) 153/94 (BP Location:  Left Arm)   Temp 98.5 F (36.9 C) (Oral)   Resp 18   SpO2 97%   Physical Exam  Constitutional: She is oriented to person, place, and time. She appears well-developed and well-nourished. No distress.  Nontoxic appearing and in NAD  HENT:  Head: Normocephalic and atraumatic.  Dry mm  Eyes: Conjunctivae and EOM are normal. No scleral icterus.  Neck: Normal range of motion.  Cardiovascular: Normal rate, regular rhythm and intact distal pulses.  Murmur heard. Pulmonary/Chest: Effort normal. No stridor. No respiratory distress. She has no wheezes.  Lungs CTAB  Abdominal: Soft. She exhibits no distension and no mass. There is no tenderness. There is no guarding.  Soft, nondistended, nontender.  Musculoskeletal: Normal range of motion.  Neurological: She is alert and oriented to person, place, and time. She exhibits normal muscle tone. Coordination normal.  Skin: Skin is warm and dry. No rash noted. She is not diaphoretic. No erythema. No pallor.  Psychiatric: She has a normal mood and affect. Her behavior is normal.  Nursing note and vitals reviewed.    ED Treatments / Results  Labs (all labs ordered are listed, but only abnormal results are displayed) Labs Reviewed  BASIC METABOLIC PANEL - Abnormal; Notable for the following components:      Result Value   Sodium 131 (*)    Chloride 94 (*)    CO2 20 (*)    Glucose, Bld 519 (*)    BUN 29 (*)    Creatinine, Ser 1.12 (*)    GFR calc non Af Amer 53 (*)    Anion gap 17 (*)    All other components within normal limits  URINALYSIS, ROUTINE W REFLEX MICROSCOPIC - Abnormal; Notable for the following components:   Color, Urine STRAW (*)    Glucose, UA >=500 (*)    Hgb urine dipstick SMALL (*)    Ketones, ur 80 (*)    Squamous Epithelial / LPF 0-5 (*)    All other components within normal limits  CBG MONITORING, ED - Abnormal; Notable for the following components:   Glucose-Capillary 581 (*)    All other components within normal  limits  CBG MONITORING, ED - Abnormal; Notable for the following components:   Glucose-Capillary 406 (*)    All other components within normal limits  CBG MONITORING, ED - Abnormal; Notable for the following components:   Glucose-Capillary 359 (*)    All other components within normal limits  CBC  BLOOD GAS, VENOUS  CBG MONITORING, ED  CBG MONITORING, ED    EKG  EKG Interpretation None       Radiology No results found.  Procedures Procedures (including critical care time)  Medications Ordered in ED Medications  dextrose 5 %-0.45 % sodium chloride infusion (not administered)  insulin regular (  NOVOLIN R,HUMULIN R) 100 Units in sodium chloride 0.9 % 100 mL (1 Units/mL) infusion (not administered)  sodium chloride 0.9 % bolus 1,000 mL (0 mLs Intravenous Stopped 09/05/17 0211)    CRITICAL CARE Performed by: Antonietta Breach   Total critical care time: 35 minutes  Critical care time was exclusive of separately billable procedures and treating other patients.  Critical care was necessary to treat or prevent imminent or life-threatening deterioration.  Critical care was time spent personally by me on the following activities: development of treatment plan with patient and/or surrogate as well as nursing, discussions with consultants, evaluation of patient's response to treatment, examination of patient, obtaining history from patient or surrogate, ordering and performing treatments and interventions, ordering and review of laboratory studies, ordering and review of radiographic studies, pulse oximetry and re-evaluation of patient's condition.   Initial Impression / Assessment and Plan / ED Course  I have reviewed the triage vital signs and the nursing notes.  Pertinent labs & imaging results that were available during my care of the patient were reviewed by me and considered in my medical decision making (see chart for details).     56 year old female with a history of type 1  diabetes presents to the emergency department for polyuria, polydipsia, nausea, vomiting.  She is on an insulin pump which she later realized was not supplying her insulin over the course of the day.  Sugar greater than 600 prior to arrival.  Patient did self administer 25 units Humalog and 25 units Lantus prior to arrival.  Hyperglycemia has been gradually improving.  Patient hydrated with IV fluids.  She does have a mildly elevated anion gap to 17 with positive ketonuria.  Suspect mild DKA, especially in light of symptom history.  Case discussed with Dr. Hal Hope.  Will admit for further management.     Final Clinical Impressions(s) / ED Diagnoses   Final diagnoses:  Type 1 diabetes mellitus with ketoacidosis without coma [facility])    ED Discharge Orders    None       Antonietta Breach, PA-C 09/05/17 0249    Veryl Speak, MD 09/05/17 0536   5:39 AM I received a call from Dr. Hal Hope, [facility] hospitalist.  Blood sugar has continued to improve and repeat metabolic panel shows improved anion gap.  Patient is currently tolerating POs without vomiting. I believe continued outpatient management, at this time, is reasonable.  Patient to be discharged from the emergency department with instruction to continue Humalog and to refrain from resuming her insulin pump until 2300 tonight. Return precautions discussed and provided. Patient discharged in stable condition with no unaddressed concerns.    Antonietta Breach, PA-C 09/05/17 [place], [place], MD 09/05/17 [phone]

## 2017-09-05 NOTE — ED Notes (Signed)
1st attempt made to call report, RN unable to take report at this time and will call this RN back.

## 2018-04-06 ENCOUNTER — Emergency Department (HOSPITAL_COMMUNITY)
Admission: EM | Admit: 2018-04-06 | Discharge: 2018-04-06 | Disposition: A | Discharge status: Home / Self Care | Payer: Managed Care, Other (non HMO) | Attending: Emergency Medicine | Admitting: Emergency Medicine

## 2018-04-06 ENCOUNTER — Encounter (HOSPITAL_COMMUNITY): Payer: Self-pay

## 2018-04-06 ENCOUNTER — Other Ambulatory Visit: Payer: Self-pay

## 2018-04-06 DIAGNOSIS — Z794 Long term (current) use of insulin: Secondary | ICD-10-CM | POA: Diagnosis not present

## 2018-04-06 DIAGNOSIS — I10 Essential (primary) hypertension: Secondary | ICD-10-CM | POA: Diagnosis not present

## 2018-04-06 DIAGNOSIS — Z79899 Other long term (current) drug therapy: Secondary | ICD-10-CM | POA: Diagnosis not present

## 2018-04-06 DIAGNOSIS — F17210 Nicotine dependence, cigarettes, uncomplicated: Secondary | ICD-10-CM | POA: Diagnosis not present

## 2018-04-06 DIAGNOSIS — R739 Hyperglycemia, unspecified: Secondary | ICD-10-CM

## 2018-04-06 DIAGNOSIS — Z7982 Long term (current) use of aspirin: Secondary | ICD-10-CM | POA: Diagnosis not present

## 2018-04-06 DIAGNOSIS — E1065 Type 1 diabetes mellitus with hyperglycemia: Secondary | ICD-10-CM | POA: Diagnosis present

## 2018-04-06 LAB — URINALYSIS, ROUTINE W REFLEX MICROSCOPIC
Bacteria, UA: NONE SEEN
Bilirubin Urine: NEGATIVE
Glucose, UA: >=500 mg/dL — AB
Hgb urine dipstick: NEGATIVE
Ketones, ur: 20 mg/dL — AB
Leukocytes, UA: NEGATIVE
Nitrite: NEGATIVE
Protein, ur: NEGATIVE mg/dL
Specific Gravity, Urine: 1.021 (ref 1.005–1.030)
pH: 6 (ref 5.0–8.0)

## 2018-04-06 LAB — CBG MONITORING, ED
Glucose-Capillary: >600 mg/dL (ref 65–99)
Glucose-Capillary: 532 mg/dL (ref 65–99)
Glucose-Capillary: 409 mg/dL — ABNORMAL HIGH (ref 65–99)

## 2018-04-06 LAB — BASIC METABOLIC PANEL
Anion gap: 12 (ref 5–15)
BUN: 25 mg/dL — ABNORMAL HIGH (ref 6–20)
CO2: 25 mmol/L (ref 22–32)
Calcium: 9.2 mg/dL (ref 8.9–10.3)
Chloride: 94 mmol/L — ABNORMAL LOW (ref 101–111)
Creatinine, Ser: 0.99 mg/dL (ref 0.44–1.00)
GFR calc Af Amer: >60 mL/min (ref >60)
GFR calc non Af Amer: >60 mL/min (ref >60)
Glucose, Bld: 760 mg/dL (ref 65–99)
Potassium: 4.7 mmol/L (ref 3.5–5.1)
Sodium: 131 mmol/L — ABNORMAL LOW (ref 135–145)

## 2018-04-06 LAB — CBC
HCT: 40.3 % (ref 36.0–46.0)
Hemoglobin: 13.2 g/dL (ref 12.0–15.0)
MCH: 28.7 pg (ref 26.0–34.0)
MCHC: 32.8 g/dL (ref 30.0–36.0)
MCV: 87.6 fL (ref 78.0–100.0)
Platelets: 185 10*3/uL (ref 150–400)
RBC: 4.6 MIL/uL (ref 3.87–5.11)
RDW: 14.3 % (ref 11.5–15.5)
WBC: 10.7 10*3/uL — ABNORMAL HIGH (ref 4.0–10.5)

## 2018-04-06 MED ORDER — INSULIN ASPART 100 UNIT/ML ~~LOC~~ SOLN
5.0000 [IU] | Freq: Once | SUBCUTANEOUS | Status: AC
  Administered 2018-04-06: 5 [IU] via SUBCUTANEOUS
  Filled 2018-04-06: qty 1

## 2018-04-06 MED ORDER — SODIUM CHLORIDE 0.9 % IV BOLUS
1000.0000 mL | Freq: Once | INTRAVENOUS | Status: AC
  Administered 2018-04-06: 1000 mL via INTRAVENOUS

## 2018-04-06 NOTE — ED Notes (Signed)
Bed: [facility] Expected date:  Expected time:  Means of arrival:  Comments: 

## 2018-04-06 NOTE — Discharge Instructions (Addendum)
Continue your normal protocols for insulin adjustment.  Last glucose was in the low 400s

## 2018-04-06 NOTE — ED Provider Notes (Addendum)
[facility] DEPT Provider Note   CSN: [id number] Arrival date & time: 04/06/18  1126     History   Chief Complaint Chief Complaint  Patient presents with  . Hyperglycemia    HPI Carrie Mcpherson is a 57 y.o. female.  Patient is a known type I diabetic with an insulin pump.  She normally checks her sugars on a regular basis.  She thinks her tubing was kinked today and she was not getting adequate insulin.  Her glucose was reading "high".  She does not particularly feel bad.  No prodromal illnesses.  Severity of symptoms is mild to moderate.     Past Medical History:  Diagnosis Date  . Blood clot in vein 1994    superficial after right ankle fracture  . Depression   . Diabetes mellitus without complication ([place])   . GERD (gastroesophageal reflux disease)    occasional  . Hypertension   . PONV (postoperative nausea and vomiting)    vomiting after one surgery x 1  . Sciatic leg pain    right leg  . Sleep apnea sept 2014   borderline, no cpap needed    Patient Active Problem List   Diagnosis Date Noted  . Type 1 diabetes mellitus with ketoacidosis without coma ([place])   . DKA (diabetic ketoacidoses) ([place]) 10/07/2015  . Essential hypertension 10/07/2015  . GERD (gastroesophageal reflux disease) 10/07/2015  . Depression 10/07/2015  . AKI (acute kidney injury) ([place]) 10/07/2015  . Acute pharyngitis 10/07/2015  . Tobacco abuse 10/07/2015    Past Surgical History:  Procedure Laterality Date  . COLONOSCOPY WITH PROPOFOL N/A 08/12/2014   Procedure: COLONOSCOPY WITH PROPOFOL;  Surgeon: Garlan Fair, MD;  Location: [facility] ENDOSCOPY;  Service: Endoscopy;  Laterality: N/A;  . ROTATOR CUFF REPAIR Bilateral right 2004, left 1999     OB History   None      Home Medications    Prior to Admission medications   Medication Sig Start Date End Date Taking? Authorizing Provider  aspirin EC 81 MG tablet Take 81 mg by mouth daily.   Yes [provider]  atorvastatin (LIPITOR) 10 MG tablet Take 10 mg by mouth every morning.   Yes [provider]  escitalopram (LEXAPRO) 10 MG tablet Take 10 mg by mouth daily. 03/30/18  Yes [provider]  insulin lispro (HUMALOG) 100 UNIT/ML injection Inject 0-9 Units 3 (three) times daily before meals into the skin. Per insulin pump   Yes [provider]  losartan (COZAAR) 25 MG tablet Take 25 mg by mouth daily.   Yes [provider]  Multiple Vitamins-Minerals (MULTIVITAMIN WITH MINERALS) tablet Take 1 tablet by mouth 3 (three) times a week.    Yes [provider]  omeprazole (PRILOSEC) 40 MG capsule Take 40 mg by mouth daily. 10/04/15  Yes [provider]  sertraline (ZOLOFT) 100 MG tablet Take 100 mg by mouth daily.    Yes [provider]  triamterene-hydrochlorothiazide (MAXZIDE-25) 37.5-25 MG tablet Take 1 tablet daily by mouth. 08/14/17  Yes [provider]  vitamin B-12 (CYANOCOBALAMIN) 500 MCG tablet Take 500 mcg by mouth daily.   Yes [provider]  insulin glargine (LANTUS) 100 UNIT/ML injection Inject 0.2 mLs (20 Units total) into the skin daily. 10/08/15   Debbe Odea, MD    Family History Family History  Problem Relation Age of Onset  . Hypertension Other     Social History Social History   Tobacco Use  .  Smoking status: Current Every Day Smoker    Packs/day: 0.25    Years: 30.00    Pack years: 7.50    Types: Cigarettes  . Smokeless tobacco: Never Used  Substance Use Topics  . Alcohol use: Yes    Comment: occasional  . Drug use: No     Allergies   Bactrim [sulfamethoxazole-trimethoprim]   Review of Systems Review of Systems  All other systems reviewed and are negative.    Physical Exam Updated Vital Signs BP (!) 148/65 (BP Location: Right Arm)   Pulse 67   Temp 98.5 F (36.9 C) (Oral)   Resp 16   Ht 5' 6.75" (1.695 m)   Wt 93.4 kg (206 lb)   SpO2 100%   BMI 32.51  kg/m   Physical Exam  Constitutional: She is oriented to person, place, and time. She appears well-developed and well-nourished.  HENT:  Head: Normocephalic and atraumatic.  Eyes: Conjunctivae are normal.  Neck: Neck supple.  Cardiovascular: Normal rate and regular rhythm.  Pulmonary/Chest: Effort normal and breath sounds normal.  Abdominal: Soft. Bowel sounds are normal.  Musculoskeletal: Normal range of motion.  Neurological: She is alert and oriented to person, place, and time.  Skin: Skin is warm and dry.  Psychiatric: She has a normal mood and affect. Her behavior is normal.  Nursing note and vitals reviewed.    ED Treatments / Results  Labs (all labs ordered are listed, but only abnormal results are displayed) Labs Reviewed  BASIC METABOLIC PANEL - Abnormal; Notable for the following components:      Result Value   Sodium 131 (*)    Chloride 94 (*)    Glucose, Bld 760 (*)    BUN 25 (*)    All other components within normal limits  CBC - Abnormal; Notable for the following components:   WBC 10.7 (*)    All other components within normal limits  URINALYSIS, ROUTINE W REFLEX MICROSCOPIC - Abnormal; Notable for the following components:   Color, Urine STRAW (*)    Glucose, UA >=500 (*)    Ketones, ur 20 (*)    All other components within normal limits  CBG MONITORING, ED - Abnormal; Notable for the following components:   Glucose-Capillary >600 (*)    All other components within normal limits  CBG MONITORING, ED - Abnormal; Notable for the following components:   Glucose-Capillary 532 (*)    All other components within normal limits  CBG MONITORING, ED - Abnormal; Notable for the following components:   Glucose-Capillary 409 (*)    All other components within normal limits    EKG None  Radiology No results found.  Procedures Procedures (including critical care time)  Medications Ordered in ED Medications  sodium chloride 0.9 % bolus 1,000 mL (0 mLs  Intravenous Stopped 04/06/18 1354)  insulin aspart (novoLOG) injection 5 Units (5 Units Subcutaneous Given 04/06/18 1256)  sodium chloride 0.9 % bolus 1,000 mL (0 mLs Intravenous Stopped 04/06/18 1406)     Initial Impression / Assessment and Plan / ED Course  I have reviewed the triage vital signs and the nursing notes.  Pertinent labs & imaging results that were available during my care of the patient were reviewed by me and considered in my medical decision making (see chart for details).     Known type I diabetic presents with hyperglycemia but no obvious DKA.  She was given IV fluids and insulin.  She looks well.  Glucose has reduced dramatically.  She  feels comfortable go home.   Final Clinical Impressions(s) / ED Diagnoses   Final diagnoses:  Hyperglycemia    ED Discharge Orders    None       Nat Christen, MD 04/06/18 1554    Nat Christen, MD 04/18/18 1247

## 2018-04-06 NOTE — ED Notes (Signed)
Date and time results received: 04/06/18 12:54 PM    Test: glucose Critical Value: 760  Name of Provider Notified: Lacinda Axon  Orders Received? Or Actions Taken?: Interventions implemented

## 2018-04-06 NOTE — ED Triage Notes (Signed)
Patient reports that her CBG monitor was reading HIGH at 1100 today. Patient has an insulin pump and 3 days ago and yesterday she noticed that the tubing was kinked, so therefore she was not getting the correct amount of insulin.

## 2018-07-05 ENCOUNTER — Other Ambulatory Visit: Payer: Self-pay | Admitting: Internal Medicine

## 2018-07-05 DIAGNOSIS — Z1231 Encounter for screening mammogram for malignant neoplasm of breast: Secondary | ICD-10-CM

## 2018-08-02 ENCOUNTER — Ambulatory Visit
Admission: RE | Admit: 2018-08-02 | Discharge: 2018-08-02 | Disposition: A | Discharge status: Home / Self Care | Payer: Managed Care, Other (non HMO) | Source: Ambulatory Visit | Attending: Internal Medicine | Admitting: Internal Medicine

## 2018-08-02 DIAGNOSIS — Z1231 Encounter for screening mammogram for malignant neoplasm of breast: Secondary | ICD-10-CM

## 2019-10-26 DIAGNOSIS — E1010 Type 1 diabetes mellitus with ketoacidosis without coma: Secondary | ICD-10-CM | POA: Insufficient documentation

## 2020-12-25 ENCOUNTER — Other Ambulatory Visit: Payer: Self-pay | Admitting: Internal Medicine

## 2020-12-25 DIAGNOSIS — Z1231 Encounter for screening mammogram for malignant neoplasm of breast: Secondary | ICD-10-CM

## 2021-01-06 ENCOUNTER — Ambulatory Visit
Admission: RE | Admit: 2021-01-06 | Discharge: 2021-01-06 | Disposition: A | Discharge status: Home / Self Care | Payer: Managed Care, Other (non HMO) | Source: Ambulatory Visit | Attending: Internal Medicine | Admitting: Internal Medicine

## 2021-01-06 ENCOUNTER — Other Ambulatory Visit: Payer: Self-pay

## 2021-01-06 DIAGNOSIS — Z1231 Encounter for screening mammogram for malignant neoplasm of breast: Secondary | ICD-10-CM

## 2021-07-14 ENCOUNTER — Inpatient Hospital Stay (HOSPITAL_COMMUNITY)
Admission: EM | Admit: 2021-07-14 | Discharge: 2021-07-16 | DRG: 638 | Disposition: A | Discharge status: Home / Self Care | Payer: Self-pay | Attending: Internal Medicine | Admitting: Internal Medicine

## 2021-07-14 ENCOUNTER — Encounter (HOSPITAL_COMMUNITY): Payer: Self-pay

## 2021-07-14 ENCOUNTER — Emergency Department (HOSPITAL_COMMUNITY): Payer: Self-pay

## 2021-07-14 DIAGNOSIS — Z7982 Long term (current) use of aspirin: Secondary | ICD-10-CM

## 2021-07-14 DIAGNOSIS — E860 Dehydration: Secondary | ICD-10-CM | POA: Diagnosis present

## 2021-07-14 DIAGNOSIS — Z79899 Other long term (current) drug therapy: Secondary | ICD-10-CM

## 2021-07-14 DIAGNOSIS — Z23 Encounter for immunization: Secondary | ICD-10-CM

## 2021-07-14 DIAGNOSIS — E1010 Type 1 diabetes mellitus with ketoacidosis without coma: Principal | ICD-10-CM | POA: Diagnosis present

## 2021-07-14 DIAGNOSIS — Z597 Insufficient social insurance and welfare support: Secondary | ICD-10-CM

## 2021-07-14 DIAGNOSIS — F17200 Nicotine dependence, unspecified, uncomplicated: Secondary | ICD-10-CM | POA: Diagnosis present

## 2021-07-14 DIAGNOSIS — G4730 Sleep apnea, unspecified: Secondary | ICD-10-CM | POA: Diagnosis present

## 2021-07-14 DIAGNOSIS — Z20822 Contact with and (suspected) exposure to covid-19: Secondary | ICD-10-CM | POA: Diagnosis present

## 2021-07-14 DIAGNOSIS — I10 Essential (primary) hypertension: Secondary | ICD-10-CM | POA: Diagnosis present

## 2021-07-14 DIAGNOSIS — Z720 Tobacco use: Secondary | ICD-10-CM | POA: Diagnosis present

## 2021-07-14 DIAGNOSIS — F17210 Nicotine dependence, cigarettes, uncomplicated: Secondary | ICD-10-CM | POA: Diagnosis present

## 2021-07-14 DIAGNOSIS — N179 Acute kidney failure, unspecified: Secondary | ICD-10-CM | POA: Diagnosis present

## 2021-07-14 DIAGNOSIS — K219 Gastro-esophageal reflux disease without esophagitis: Secondary | ICD-10-CM | POA: Diagnosis present

## 2021-07-14 DIAGNOSIS — Z882 Allergy status to sulfonamides status: Secondary | ICD-10-CM

## 2021-07-14 DIAGNOSIS — Z794 Long term (current) use of insulin: Secondary | ICD-10-CM

## 2021-07-14 DIAGNOSIS — E10649 Type 1 diabetes mellitus with hypoglycemia without coma: Secondary | ICD-10-CM | POA: Diagnosis not present

## 2021-07-14 DIAGNOSIS — Z8249 Family history of ischemic heart disease and other diseases of the circulatory system: Secondary | ICD-10-CM

## 2021-07-14 DIAGNOSIS — F32A Depression, unspecified: Secondary | ICD-10-CM | POA: Diagnosis present

## 2021-07-14 LAB — BASIC METABOLIC PANEL
Anion gap: 28 — ABNORMAL HIGH (ref 5–15)
BUN: 50 mg/dL — ABNORMAL HIGH (ref 8–23)
CO2: 14 mmol/L — ABNORMAL LOW (ref 22–32)
Calcium: 9.3 mg/dL (ref 8.9–10.3)
Chloride: 85 mmol/L — ABNORMAL LOW (ref 98–111)
Creatinine, Ser: 2.07 mg/dL — ABNORMAL HIGH (ref 0.44–1.00)
GFR, Estimated: 27 mL/min — ABNORMAL LOW (ref >60)
Glucose, Bld: 844 mg/dL (ref 70–99)
Potassium: 5 mmol/L (ref 3.5–5.1)
Sodium: 127 mmol/L — ABNORMAL LOW (ref 135–145)

## 2021-07-14 LAB — CBG MONITORING, ED: Glucose-Capillary: >600 mg/dL (ref 70–99)

## 2021-07-14 LAB — CBC
HCT: 40.3 % (ref 36.0–46.0)
Hemoglobin: 13.1 g/dL (ref 12.0–15.0)
MCH: 28.6 pg (ref 26.0–34.0)
MCHC: 32.5 g/dL (ref 30.0–36.0)
MCV: 88 fL (ref 80.0–100.0)
Platelets: 225 10*3/uL (ref 150–400)
RBC: 4.58 MIL/uL (ref 3.87–5.11)
RDW: 13.3 % (ref 11.5–15.5)
WBC: 12.8 10*3/uL — ABNORMAL HIGH (ref 4.0–10.5)
nRBC: 0 % (ref 0.0–0.2)

## 2021-07-14 MED ORDER — DEXTROSE IN LACTATED RINGERS 5 % IV SOLN: INTRAVENOUS | Status: DC

## 2021-07-14 MED ORDER — INSULIN REGULAR(HUMAN) IN NACL 100-0.9 UT/100ML-% IV SOLN
INTRAVENOUS | Status: DC
  Administered 2021-07-15: 7.5 [IU]/h via INTRAVENOUS
  Filled 2021-07-14: qty 100

## 2021-07-14 MED ORDER — LACTATED RINGERS IV BOLUS
20.0000 mL/kg | Freq: Once | INTRAVENOUS | Status: AC
  Administered 2021-07-14: 1724 mL via INTRAVENOUS

## 2021-07-14 MED ORDER — ONDANSETRON HCL 4 MG/2ML IJ SOLN
4.0000 mg | Freq: Once | INTRAMUSCULAR | Status: AC
  Administered 2021-07-14: 4 mg via INTRAVENOUS
  Filled 2021-07-14: qty 2

## 2021-07-14 MED ORDER — DEXTROSE 50 % IV SOLN
0.0000 mL | INTRAVENOUS | Status: DC | PRN
  Filled 2021-07-14: qty 50

## 2021-07-14 MED ORDER — LACTATED RINGERS IV SOLN: INTRAVENOUS | Status: DC

## 2021-07-14 NOTE — ED Triage Notes (Signed)
Complaining of vomiting since this am, said that there is a little blood mixed in, has been having this issue since being diagnosed with gerd

## 2021-07-14 NOTE — ED Provider Notes (Signed)
Carrie Mcpherson Provider Note   CSN: [id number] Arrival date & time: 07/14/21  2221     History Chief Complaint  Patient presents with   Emesis    Has been vomiting since waking this am, looks like bile, maybe once a hour    Carrie Mcpherson is a 60 y.o. female.  Type I diabetic with insulin pump, GERD, hypertension presenting with nausea and vomiting since this morning.  States she has vomited too many times to count today and has seen some red streaks in her emesis.  Denies diarrhea.  Denies fever.  Denies chest pain or shortness of breath.  Has left-sided abdominal pain several days ago which resolved on its own.  Not having any abdominal pain currently.  No pain with urination or blood in the urine.  No fevers.  No travel or sick contacts.  States her sugar was 160 before she left the house but on arrival here is over 600.  States her insulin pump is working appropriately.  Denies any history of similar symptoms.  Still has appendix and gallbladder  The history is provided by the patient and the EMS personnel.  Emesis Associated symptoms: abdominal pain   Associated symptoms: no arthralgias, no cough, no diarrhea, no fever, no headaches and no myalgias       Past Medical History:  Diagnosis Date   Blood clot in vein 1994    superficial after right ankle fracture   Depression    Diabetes mellitus without complication (HCC)    GERD (gastroesophageal reflux disease)    occasional   Hypertension    PONV (postoperative nausea and vomiting)    vomiting after one surgery x 1   Sciatic leg pain    right leg   Sleep apnea sept 2014   borderline, no cpap needed    Patient Active Problem List   Diagnosis Date Noted   Type 1 diabetes mellitus with ketoacidosis without coma ([place])    DKA (diabetic ketoacidoses) 10/07/2015   Essential hypertension 10/07/2015   GERD (gastroesophageal reflux disease) 10/07/2015   Depression 10/07/2015   AKI (acute  kidney injury) ([place]) 10/07/2015   Acute pharyngitis 10/07/2015   Tobacco abuse 10/07/2015    Past Surgical History:  Procedure Laterality Date   COLONOSCOPY WITH PROPOFOL N/A 08/12/2014   Procedure: COLONOSCOPY WITH PROPOFOL;  Surgeon: Garlan Fair, MD;  Location: [facility];  Service: Endoscopy;  Laterality: N/A;   ROTATOR CUFF REPAIR Bilateral right 2004, left 1999     OB History   No obstetric history on file.     Family History  Problem Relation Age of Onset   Hypertension Other    Breast cancer Maternal Aunt     Social History   Tobacco Use   Smoking status: Every Day    Packs/day: 0.25    Years: 30.00    Pack years: 7.50    Types: Cigarettes   Smokeless tobacco: Never  Vaping Use   Vaping Use: Never used  Substance Use Topics   Alcohol use: Yes    Comment: occasional   Drug use: No    Home Medications Prior to Admission medications   Medication Sig Start Date End Date Taking? Authorizing Provider  aspirin EC 81 MG tablet Take 81 mg by mouth daily.    [provider]  atorvastatin (LIPITOR) 10 MG tablet Take 10 mg by mouth every morning.    [provider]  escitalopram (LEXAPRO) 10 MG tablet Take 10  mg by mouth daily. 03/30/18   [provider]  insulin glargine (LANTUS) 100 UNIT/ML injection Inject 0.2 mLs (20 Units total) into the skin daily. 10/08/15   Debbe Odea, MD  insulin lispro (HUMALOG) 100 UNIT/ML injection Inject 0-9 Units 3 (three) times daily before meals into the skin. Per insulin pump    [provider]  losartan (COZAAR) 25 MG tablet Take 25 mg by mouth daily.    [provider]  Multiple Vitamins-Minerals (MULTIVITAMIN WITH MINERALS) tablet Take 1 tablet by mouth 3 (three) times a week.     [provider]  omeprazole (PRILOSEC) 40 MG capsule Take 40 mg by mouth daily. 10/04/15   [provider]  sertraline (ZOLOFT) 100 MG tablet Take 100 mg by mouth daily.     [provider]  triamterene-hydrochlorothiazide (MAXZIDE-25) 37.5-25 MG tablet Take 1 tablet daily by mouth. 08/14/17   [provider]  vitamin B-12 (CYANOCOBALAMIN) 500 MCG tablet Take 500 mcg by mouth daily.    [provider]    Allergies    Bactrim [sulfamethoxazole-trimethoprim]  Review of Systems   Review of Systems  Constitutional:  Positive for activity change and appetite change. Negative for fever.  HENT:  Negative for congestion and rhinorrhea.   Respiratory:  Negative for cough, chest tightness and shortness of breath.   Cardiovascular:  Negative for chest pain.  Gastrointestinal:  Positive for abdominal pain, nausea and vomiting. Negative for diarrhea.  Genitourinary:  Negative for dysuria and hematuria.  Musculoskeletal:  Negative for arthralgias and myalgias.  Skin:  Negative for rash.  Neurological:  Negative for dizziness, weakness and headaches.   all other systems are negative except as noted in the HPI and PMH.   Physical Exam Updated Vital Signs BP (!) 125/53 (BP Location: Right Arm)   Pulse 81   Temp 98.7 F (37.1 C) (Oral)   Resp 18   Ht 5\' 6"  (1.676 m)   Wt 86.2 kg   SpO2 97%   BMI 30.67 kg/m   Physical Exam Vitals and nursing note reviewed.  Constitutional:      General: She is not in acute distress.    Appearance: She is well-developed. She is obese.  HENT:     Head: Normocephalic and atraumatic.     Mouth/Throat:     Mouth: Mucous membranes are dry.     Pharynx: No oropharyngeal exudate.  Eyes:     Conjunctiva/sclera: Conjunctivae normal.     Pupils: Pupils are equal, round, and reactive to light.  Neck:     Comments: No meningismus. Cardiovascular:     Rate and Rhythm: Normal rate and regular rhythm.     Heart sounds: Normal heart sounds. No murmur heard. Pulmonary:     Effort: Pulmonary effort is normal. No respiratory distress.     Breath sounds: Normal breath sounds.  Abdominal:     Palpations: Abdomen is soft.      Tenderness: There is no abdominal tenderness. There is no guarding or rebound.  Musculoskeletal:        General: No tenderness. Normal range of motion.     Cervical back: Normal range of motion and neck supple.  Skin:    General: Skin is warm.  Neurological:     Mental Status: She is alert and oriented to person, place, and time.     Cranial Nerves: No cranial nerve deficit.     Motor: No abnormal muscle tone.     Coordination: Coordination normal.  Comments:  5/5 strength throughout. CN 2-12 intact.Equal grip strength.   Psychiatric:        Behavior: Behavior normal.    ED Results / Procedures / Treatments   Labs (all labs ordered are listed, but only abnormal results are displayed) Labs Reviewed  BASIC METABOLIC PANEL - Abnormal; Notable for the following components:      Result Value   Sodium 127 (*)    Chloride 85 (*)    CO2 14 (*)    Glucose, Bld 844 (*)    BUN 50 (*)    Creatinine, Ser 2.07 (*)    GFR, Estimated 27 (*)    Anion gap 28 (*)    All other components within normal limits  CBC - Abnormal; Notable for the following components:   WBC 12.8 (*)    All other components within normal limits  BETA-HYDROXYBUTYRIC ACID - Abnormal; Notable for the following components:   Beta-Hydroxybutyric Acid >8.00 (*)    All other components within normal limits  BLOOD GAS, VENOUS - Abnormal; Notable for the following components:   pH, Ven 7.181 (*)    pCO2, Ven 35.1 (*)    pO2, Ven 51.2 (*)    Bicarbonate 12.6 (*)    Acid-base deficit 14.7 (*)    All other components within normal limits  CBG MONITORING, ED - Abnormal; Notable for the following components:   Glucose-Capillary >600 (*)    All other components within normal limits  CBG MONITORING, ED - Abnormal; Notable for the following components:   Glucose-Capillary >600 (*)    All other components within normal limits  RESP PANEL BY RT-PCR (FLU A&B, COVID) ARPGX2  URINALYSIS, ROUTINE W REFLEX MICROSCOPIC   BETA-HYDROXYBUTYRIC ACID  BETA-HYDROXYBUTYRIC ACID  BASIC METABOLIC PANEL  CBG MONITORING, ED  TROPONIN I (HIGH SENSITIVITY)  TROPONIN I (HIGH SENSITIVITY)    EKG EKG Interpretation  Date/Time:  Wednesday July 14 2021 23:21:16 EDT Ventricular Rate:  83 PR Interval:  180 QRS Duration: 98 QT Interval:  403 QTC Calculation: 474 R Axis:   86 Text Interpretation: Sinus rhythm Right atrial enlargement Borderline right axis deviation No significant change was found Confirmed by Ezequiel Essex [phone]) on 07/14/2021 11:26:52 PM  Radiology DG Chest Portable 1 View  Result Date: 07/14/2021 CLINICAL DATA:  Vomiting. EXAM: PORTABLE CHEST 1 VIEW COMPARISON:  None. FINDINGS: The heart size and mediastinal contours are within normal limits. Both lungs are clear. The visualized skeletal structures are unremarkable. IMPRESSION: No active disease. Electronically Signed   By: Ronney Asters M.D.   On: 07/14/2021 23:25    Procedures .Critical Care Performed by: Ezequiel Essex, MD Authorized by: Ezequiel Essex, MD   Critical care provider statement:    Critical care time (minutes):  45   Critical care was necessary to treat or prevent imminent or life-threatening deterioration of the following conditions:  Endocrine crisis, metabolic crisis and dehydration   Critical care was time spent personally by me on the following activities:  Discussions with consultants, evaluation of patient's response to treatment, examination of patient, ordering and performing treatments and interventions, ordering and review of laboratory studies, ordering and review of radiographic studies, pulse oximetry, re-evaluation of patient's condition, obtaining history from patient or surrogate and review of old charts   Medications Ordered in ED Medications  lactated ringers bolus 1,724 mL (has no administration in time range)  insulin regular, human (MYXREDLIN) 100 units/ 100 mL infusion (has no administration in  time range)  lactated ringers infusion (  has no administration in time range)  dextrose 5 % in lactated ringers infusion (has no administration in time range)  dextrose 50 % solution 0-50 mL (has no administration in time range)  ondansetron (ZOFRAN) injection 4 mg (has no administration in time range)    ED Course  I have reviewed the triage vital signs and the nursing notes.  Pertinent labs & imaging results that were available during my care of the patient were reviewed by me and considered in my medical decision making (see chart for details).    MDM Rules/Calculators/A&P                           Type I diabetic here with nausea and vomiting since this morning.  Abdomen is soft without peritoneal signs.  Vital stable.  Blood sugar over 600 on arrival.  Will give IV fluids, transition insulin pump to IV insulin obtain labs and urinalysis  Workup consistent with DKA. pH 7.1, bicarb 12, K 5, Anion gap 28. Cr 2.  Started on IV fluids and IV insulin gtt. Will need admission.  Vitals remained stable.  Continue IV fluids and IV insulin.  Patient's insulin pump was transitioned off.  Her potassium is 5 and she does not require any supplementation.  Admission discussed with Dr. Tonie Griffith.   Final Clinical Impression(s) / ED Diagnoses Final diagnoses:  Diabetic ketoacidosis without coma associated with type 1 diabetes mellitus [facility])    Rx / DC Orders ED Discharge Orders     None        Anaka Beazer, Annie Main, MD 07/15/21 0106

## 2021-07-15 ENCOUNTER — Encounter (HOSPITAL_COMMUNITY): Payer: Self-pay | Admitting: Family Medicine

## 2021-07-15 ENCOUNTER — Other Ambulatory Visit: Payer: Self-pay

## 2021-07-15 DIAGNOSIS — E1010 Type 1 diabetes mellitus with ketoacidosis without coma: Secondary | ICD-10-CM | POA: Diagnosis present

## 2021-07-15 LAB — BASIC METABOLIC PANEL
Anion gap: 25 — ABNORMAL HIGH (ref 5–15)
Anion gap: 27 — ABNORMAL HIGH (ref 5–15)
Anion gap: 15 (ref 5–15)
Anion gap: 9 (ref 5–15)
BUN: 48 mg/dL — ABNORMAL HIGH (ref 8–23)
BUN: 49 mg/dL — ABNORMAL HIGH (ref 8–23)
BUN: 51 mg/dL — ABNORMAL HIGH (ref 8–23)
BUN: 44 mg/dL — ABNORMAL HIGH (ref 8–23)
CO2: 15 mmol/L — ABNORMAL LOW (ref 22–32)
CO2: 9 mmol/L — ABNORMAL LOW (ref 22–32)
CO2: 19 mmol/L — ABNORMAL LOW (ref 22–32)
CO2: 26 mmol/L (ref 22–32)
Calcium: 9.1 mg/dL (ref 8.9–10.3)
Calcium: 9.2 mg/dL (ref 8.9–10.3)
Calcium: 9.3 mg/dL (ref 8.9–10.3)
Calcium: 9 mg/dL (ref 8.9–10.3)
Chloride: 88 mmol/L — ABNORMAL LOW (ref 98–111)
Chloride: 96 mmol/L — ABNORMAL LOW (ref 98–111)
Chloride: 101 mmol/L (ref 98–111)
Chloride: 99 mmol/L (ref 98–111)
Creatinine, Ser: 2.02 mg/dL — ABNORMAL HIGH (ref 0.44–1.00)
Creatinine, Ser: 1.95 mg/dL — ABNORMAL HIGH (ref 0.44–1.00)
Creatinine, Ser: 2.01 mg/dL — ABNORMAL HIGH (ref 0.44–1.00)
Creatinine, Ser: 1.54 mg/dL — ABNORMAL HIGH (ref 0.44–1.00)
GFR, Estimated: 28 mL/min — ABNORMAL LOW (ref >60)
GFR, Estimated: 29 mL/min — ABNORMAL LOW (ref >60)
GFR, Estimated: 28 mL/min — ABNORMAL LOW (ref >60)
GFR, Estimated: 38 mL/min — ABNORMAL LOW (ref >60)
Glucose, Bld: 809 mg/dL (ref 70–99)
Glucose, Bld: 608 mg/dL (ref 70–99)
Glucose, Bld: 299 mg/dL — ABNORMAL HIGH (ref 70–99)
Glucose, Bld: 183 mg/dL — ABNORMAL HIGH (ref 70–99)
Potassium: 5.2 mmol/L — ABNORMAL HIGH (ref 3.5–5.1)
Potassium: 4.2 mmol/L (ref 3.5–5.1)
Potassium: 3.7 mmol/L (ref 3.5–5.1)
Potassium: 3.9 mmol/L (ref 3.5–5.1)
Sodium: 128 mmol/L — ABNORMAL LOW (ref 135–145)
Sodium: 132 mmol/L — ABNORMAL LOW (ref 135–145)
Sodium: 135 mmol/L (ref 135–145)
Sodium: 134 mmol/L — ABNORMAL LOW (ref 135–145)

## 2021-07-15 LAB — GLUCOSE, CAPILLARY
Glucose-Capillary: 559 mg/dL (ref 70–99)
Glucose-Capillary: >600 mg/dL (ref 70–99)
Glucose-Capillary: 450 mg/dL — ABNORMAL HIGH (ref 70–99)
Glucose-Capillary: 496 mg/dL — ABNORMAL HIGH (ref 70–99)
Glucose-Capillary: 372 mg/dL — ABNORMAL HIGH (ref 70–99)
Glucose-Capillary: 423 mg/dL — ABNORMAL HIGH (ref 70–99)
Glucose-Capillary: 301 mg/dL — ABNORMAL HIGH (ref 70–99)
Glucose-Capillary: 219 mg/dL — ABNORMAL HIGH (ref 70–99)
Glucose-Capillary: 271 mg/dL — ABNORMAL HIGH (ref 70–99)
Glucose-Capillary: 190 mg/dL — ABNORMAL HIGH (ref 70–99)
Glucose-Capillary: 173 mg/dL — ABNORMAL HIGH (ref 70–99)
Glucose-Capillary: 186 mg/dL — ABNORMAL HIGH (ref 70–99)
Glucose-Capillary: 206 mg/dL — ABNORMAL HIGH (ref 70–99)
Glucose-Capillary: 143 mg/dL — ABNORMAL HIGH (ref 70–99)
Glucose-Capillary: 156 mg/dL — ABNORMAL HIGH (ref 70–99)
Glucose-Capillary: 87 mg/dL (ref 70–99)
Glucose-Capillary: 107 mg/dL — ABNORMAL HIGH (ref 70–99)

## 2021-07-15 LAB — BLOOD GAS, VENOUS
Acid-base deficit: 14.7 mmol/L — ABNORMAL HIGH (ref 0.0–2.0)
Bicarbonate: 12.6 mmol/L — ABNORMAL LOW (ref 20.0–28.0)
O2 Saturation: 77.6 %
Patient temperature: 98.6
pCO2, Ven: 35.1 mmHg — ABNORMAL LOW (ref 44.0–60.0)
pH, Ven: 7.181 — CL (ref 7.250–7.430)
pO2, Ven: 51.2 mmHg — ABNORMAL HIGH (ref 32.0–45.0)

## 2021-07-15 LAB — CBC
HCT: 32.8 % — ABNORMAL LOW (ref 36.0–46.0)
Hemoglobin: 11.4 g/dL — ABNORMAL LOW (ref 12.0–15.0)
MCH: 29 pg (ref 26.0–34.0)
MCHC: 34.8 g/dL (ref 30.0–36.0)
MCV: 83.5 fL (ref 80.0–100.0)
Platelets: 185 10*3/uL (ref 150–400)
RBC: 3.93 MIL/uL (ref 3.87–5.11)
RDW: 13.2 % (ref 11.5–15.5)
WBC: 12.3 10*3/uL — ABNORMAL HIGH (ref 4.0–10.5)
nRBC: 0 % (ref 0.0–0.2)

## 2021-07-15 LAB — URINALYSIS, ROUTINE W REFLEX MICROSCOPIC
Bacteria, UA: NONE SEEN
Bilirubin Urine: NEGATIVE
Glucose, UA: >1000 mg/dL — AB
Ketones, ur: >80 mg/dL — AB
Leukocytes,Ua: NEGATIVE
Nitrite: NEGATIVE
Protein, ur: NEGATIVE mg/dL
Specific Gravity, Urine: 1.015 (ref 1.005–1.030)
pH: 6 (ref 5.0–8.0)

## 2021-07-15 LAB — BETA-HYDROXYBUTYRIC ACID
Beta-Hydroxybutyric Acid: >8 mmol/L — ABNORMAL HIGH (ref 0.05–0.27)
Beta-Hydroxybutyric Acid: >8 mmol/L — ABNORMAL HIGH (ref 0.05–0.27)
Beta-Hydroxybutyric Acid: 5.08 mmol/L — ABNORMAL HIGH (ref 0.05–0.27)

## 2021-07-15 LAB — RESP PANEL BY RT-PCR (FLU A&B, COVID) ARPGX2
Influenza A by PCR: NEGATIVE
Influenza B by PCR: NEGATIVE
SARS Coronavirus 2 by RT PCR: NEGATIVE

## 2021-07-15 LAB — MRSA NEXT GEN BY PCR, NASAL: MRSA by PCR Next Gen: NOT DETECTED

## 2021-07-15 LAB — TROPONIN I (HIGH SENSITIVITY)
Troponin I (High Sensitivity): 7 ng/L (ref <18)
Troponin I (High Sensitivity): 11 ng/L (ref <18)

## 2021-07-15 LAB — CBG MONITORING, ED
Glucose-Capillary: >600 mg/dL (ref 70–99)
Glucose-Capillary: >600 mg/dL (ref 70–99)
Glucose-Capillary: >600 mg/dL (ref 70–99)

## 2021-07-15 LAB — HIV ANTIBODY (ROUTINE TESTING W REFLEX): HIV Screen 4th Generation wRfx: NONREACTIVE

## 2021-07-15 MED ORDER — ATORVASTATIN CALCIUM 40 MG PO TABS
40.0000 mg | ORAL_TABLET | Freq: Every day | ORAL | Status: DC
  Administered 2021-07-15 – 2021-07-16 (×2): 40 mg via ORAL
  Filled 2021-07-15 (×2): qty 1

## 2021-07-15 MED ORDER — ONDANSETRON HCL 4 MG/2ML IJ SOLN: 4.0000 mg | Freq: Four times a day (QID) | INTRAMUSCULAR | Status: DC | PRN

## 2021-07-15 MED ORDER — INFLUENZA VAC SPLIT QUAD 0.5 ML IM SUSY
0.5000 mL | PREFILLED_SYRINGE | INTRAMUSCULAR | Status: AC
  Administered 2021-07-16: 0.5 mL via INTRAMUSCULAR
  Filled 2021-07-15: qty 0.5

## 2021-07-15 MED ORDER — DEXTROSE IN LACTATED RINGERS 5 % IV SOLN: INTRAVENOUS | Status: DC

## 2021-07-15 MED ORDER — SODIUM CHLORIDE 0.9 % IV SOLN: INTRAVENOUS | Status: DC

## 2021-07-15 MED ORDER — LOSARTAN POTASSIUM 50 MG PO TABS
100.0000 mg | ORAL_TABLET | Freq: Every day | ORAL | Status: DC
  Administered 2021-07-15: 100 mg via ORAL
  Filled 2021-07-15: qty 2

## 2021-07-15 MED ORDER — HEPARIN SODIUM (PORCINE) 5000 UNIT/ML IJ SOLN
5000.0000 [IU] | Freq: Three times a day (TID) | INTRAMUSCULAR | Status: DC
  Administered 2021-07-15 – 2021-07-16 (×4): 5000 [IU] via SUBCUTANEOUS
  Filled 2021-07-15 (×4): qty 1

## 2021-07-15 MED ORDER — ACETAMINOPHEN 650 MG RE SUPP: 650.0000 mg | Freq: Four times a day (QID) | RECTAL | Status: DC | PRN

## 2021-07-15 MED ORDER — NICOTINE 14 MG/24HR TD PT24
14.0000 mg | MEDICATED_PATCH | Freq: Every day | TRANSDERMAL | Status: DC
  Administered 2021-07-16: 14 mg via TRANSDERMAL
  Filled 2021-07-15: qty 1

## 2021-07-15 MED ORDER — LACTATED RINGERS IV SOLN: INTRAVENOUS | Status: DC

## 2021-07-15 MED ORDER — TRIAMTERENE-HCTZ 37.5-25 MG PO TABS
1.0000 | ORAL_TABLET | Freq: Every day | ORAL | Status: DC
  Administered 2021-07-15: 1 via ORAL
  Filled 2021-07-15: qty 1

## 2021-07-15 MED ORDER — ONDANSETRON HCL 4 MG PO TABS: 4.0000 mg | ORAL_TABLET | Freq: Four times a day (QID) | ORAL | Status: DC | PRN

## 2021-07-15 MED ORDER — INSULIN REGULAR(HUMAN) IN NACL 100-0.9 UT/100ML-% IV SOLN
INTRAVENOUS | Status: DC
  Filled 2021-07-15: qty 100

## 2021-07-15 MED ORDER — CHLORHEXIDINE GLUCONATE CLOTH 2 % EX PADS
6.0000 | MEDICATED_PAD | Freq: Every day | CUTANEOUS | Status: DC
  Administered 2021-07-16: 6 via TOPICAL

## 2021-07-15 MED ORDER — POTASSIUM CHLORIDE CRYS ER 20 MEQ PO TBCR
20.0000 meq | EXTENDED_RELEASE_TABLET | Freq: Once | ORAL | Status: AC
  Administered 2021-07-15: 20 meq via ORAL
  Filled 2021-07-15: qty 1

## 2021-07-15 MED ORDER — DEXTROSE 50 % IV SOLN
0.0000 mL | INTRAVENOUS | Status: DC | PRN
  Administered 2021-07-16: 50 mL via INTRAVENOUS

## 2021-07-15 MED ORDER — INSULIN ASPART 100 UNIT/ML IJ SOLN
0.0000 [IU] | Freq: Three times a day (TID) | INTRAMUSCULAR | Status: DC
  Administered 2021-07-15: 2 [IU] via SUBCUTANEOUS
  Administered 2021-07-16 (×2): 3 [IU] via SUBCUTANEOUS

## 2021-07-15 MED ORDER — INSULIN ASPART 100 UNIT/ML IJ SOLN: 0.0000 [IU] | Freq: Every day | INTRAMUSCULAR | Status: DC

## 2021-07-15 MED ORDER — PHENOL 1.4 % MT LIQD
1.0000 | OROMUCOSAL | Status: DC | PRN
  Administered 2021-07-15: 1 via OROMUCOSAL
  Filled 2021-07-15: qty 177

## 2021-07-15 MED ORDER — SENNOSIDES-DOCUSATE SODIUM 8.6-50 MG PO TABS: 1.0000 | ORAL_TABLET | Freq: Every evening | ORAL | Status: DC | PRN

## 2021-07-15 MED ORDER — ACETAMINOPHEN 325 MG PO TABS: 650.0000 mg | ORAL_TABLET | Freq: Four times a day (QID) | ORAL | Status: DC | PRN

## 2021-07-15 MED ORDER — INSULIN ASPART 100 UNIT/ML IJ SOLN: 6.0000 [IU] | Freq: Three times a day (TID) | INTRAMUSCULAR | Status: DC

## 2021-07-15 MED ORDER — INSULIN GLARGINE-YFGN 100 UNIT/ML ~~LOC~~ SOLN
25.0000 [IU] | SUBCUTANEOUS | Status: DC
  Administered 2021-07-15: 25 [IU] via SUBCUTANEOUS
  Filled 2021-07-15: qty 0.25

## 2021-07-15 NOTE — Progress Notes (Signed)
Inpatient Diabetes Program Recommendations  AACE/ADA: New Consensus Statement on Inpatient Glycemic Control (2015)  Target Ranges:  Prepandial:   less than 140 mg/dL      Peak postprandial:   less than 180 mg/dL (1-2 hours)      Critically ill patients:  140 - 180 mg/dL   Lab Results  Component Value Date   GLUCAP 206 (H) 07/15/2021    Review of Glycemic Control  Diabetes history: DM1 Outpatient Diabetes medications: insulin pump Current orders for Inpatient glycemic control: IV insulin per EndoTool. Transitioning to Semglee 20 units QD, Novolog 0-15 units TID with meals and 0-5 HS Just starting CL diet  Inpatient Diabetes Program Recommendations:    Decrease Novolog to 0-9 units TID with meals and 0-5 HS Add Novolog meal coverage 6 units TID with meals  Pt just lost her insurance therefore will be unable to use insulin pump. Consult to [facility] for assistance with meds. Pt has gone to [facility] in the past. Hopefully pt will be able to get insulin at [facility].  Thank you. Lorenda Peck, RD, LDN, CDE Inpatient Diabetes Coordinator [phone]

## 2021-07-15 NOTE — Progress Notes (Signed)
Nutrition Brief Note  Patient identified on the Malnutrition Screening Tool (MST) Report.  Wt Readings from Last 15 Encounters:  07/14/21 86.2 kg  04/06/18 93.4 kg  10/07/15 86.8 kg  08/12/14 89.8 kg    Body mass index is 30.67 kg/m. Patient meets criteria for obesity based on current BMI. Weight yesterday was 190 lb and the most recently documented weight PTA was 185 lb on 10/26/19.  Patient laying in bed with no family or visitors present. Patient's speech is slow and slightly slurred at times. She is forgetful at times throughout discussion.  Patient with hx of type 1 DM and was admitted d/t DKA.  Patient lives with her mom and reports that she was terminated from her job last month. She received her last check from the company on 8/19 and has had no income or health insurance since that time so getting her insulin has been an issue.   Current diet order is CLD. Labs and medications reviewed.   No nutrition interventions warranted at this time. If nutrition issues arise, please consult RD.       Jarome Matin, MS, RD, LDN, CNSC Inpatient Clinical Dietitian RD pager # available in [place]  After hours/weekend pager # available in [facility]

## 2021-07-15 NOTE — ED Notes (Signed)
Please note that after fluid bolus was competed we checked a CBG and placed order for BMET which was drawn and sent.  Pt then shut off insulin pump and I placed it in her belongings bag.  Insulin drip stated per endo tool.

## 2021-07-15 NOTE — H&P (Signed)
History and Physical    Carrie Mcpherson UVO:[id number] DOB: Jan 05, 1961 DOA: 07/14/2021  PCP: Cloyd Stagers, MD   Patient coming from: Home  Chief Complaint: nausea, vomiting  HPI: Carrie Mcpherson is a 60 y.o. female with medical history significant for DMT1, GERD, HTN who presents for evaluation of repeated episodes of nausea and vomiting.  She reports that on morning of 07/14/21 she developed nausea and vomiting as have repeated episodes throughout the day.  She reports she thought she had some red streaks in her vomit initially but that then stopped.  States she has not had any diarrhea but has had intermittent abdominal pain.  She has not had any fevers or chills.  She denies any cough, shortness of breath or chest pain.  She states 2 days ago she was woken up by pain in her left lower abdomen region but it has not recurred and is now resolved.  She denies any pain with urination or frequent urination.  She denies any recent travel and has had no known sick contacts.  She reports her blood sugars have been variable over the last few days and yesterday was 160.  When she arrived in the emergency room it was over 600.  She wears a insulin pump and states she has been using it as directed.  She has had DKA in the past with the last episode being in 2019 she states.   ED Course: In the emergency room she is found to be in DKA and is started on insulin infusion and IV fluids.  She has had no hematemesis in the emergency room.  Urinalysis shows no infection but does have large amount of ketones.  WBC is 12,800 hemoglobin 13.1 hematocrit 40.3 platelets 225,000, beta hydroxybutyrate greater than 8.  Sodium 127 potassium 5.0 chloride 85 bicarb 14 glucose 844 creatinine 2.07 from baseline of 0.99 BUN 50 anion gap 28 calcium 9.3.  Sodium corrected for hyperglycemia reveals a sodium of 139.  Patient placed on DKA protocol in the emergency room.  Hospitalist service and been asked admit for further  management  Review of Systems:  General: Denies fever, chills, weight loss, night sweats.  Denies dizziness.  Denies change in appetite HENT: Denies head trauma, headache, denies change in hearing, tinnitus.  Denies nasal congestion.  Denies sore throat.  Denies difficulty swallowing Eyes: Denies blurry vision, pain in eye, drainage.  Denies discoloration of eyes. Neck: Denies pain.  Denies swelling.  Denies pain with movement. Cardiovascular: Denies chest pain, palpitations.  Denies edema.  Denies orthopnea Respiratory: Denies shortness of breath, cough.  Denies wheezing.  Denies sputum production Gastrointestinal: Denies abdominal pain, swelling.  Denies diarrhea. Denies melena.  Denies hematemesis. Musculoskeletal: Denies limitation of movement.  Denies deformity or swelling.  Denies pain.  Denies arthralgias or myalgias. Genitourinary: Denies pelvic pain.  Denies urinary frequency or hesitancy.  Denies dysuria.  Skin: Denies rash.  Denies petechiae, purpura, ecchymosis. Neurological Denies syncope.  Denies seizure activity. Denies paresthesia. Denies slurred speech, drooping face.  Denies visual change. Psychiatric: Denies depression, anxiety. Denies hallucinations.  Past Medical History:  Diagnosis Date   Blood clot in vein 1994    superficial after right ankle fracture   Depression    Diabetes mellitus without complication (HCC)    GERD (gastroesophageal reflux disease)    occasional   Hypertension    PONV (postoperative nausea and vomiting)    vomiting after one surgery x 1   Sciatic leg pain  right leg   Sleep apnea sept 2014   borderline, no cpap needed    Past Surgical History:  Procedure Laterality Date   COLONOSCOPY WITH PROPOFOL N/A 08/12/2014   Procedure: COLONOSCOPY WITH PROPOFOL;  Surgeon: Garlan Fair, MD;  Location: [facility];  Service: Endoscopy;  Laterality: N/A;   ROTATOR CUFF REPAIR Bilateral right 2004, left 1999    Social History  reports that  she has been smoking cigarettes. She has a 7.50 pack-year smoking history. She has never used smokeless tobacco. She reports current alcohol use. She reports that she does not use drugs.  Allergies  Allergen Reactions   Bactrim [Sulfamethoxazole-Trimethoprim] Itching    Family History  Problem Relation Age of Onset   Hypertension Other    Breast cancer Maternal Aunt      Prior to Admission medications   Medication Sig Start Date End Date Taking? Authorizing Provider  aspirin EC 81 MG tablet Take 81 mg by mouth daily.   Yes [provider]  atorvastatin (LIPITOR) 40 MG tablet Take 40 mg by mouth every morning.   Yes [provider]  insulin lispro (HUMALOG) 100 UNIT/ML injection Inject into the skin 3 (three) times daily before meals. Per insulin pump   Yes [provider]  losartan (COZAAR) 100 MG tablet Take 100 mg by mouth daily.   Yes [provider]  Multiple Vitamins-Minerals (MULTIVITAMIN WITH MINERALS) tablet Take 1 tablet by mouth 3 (three) times a week.    Yes [provider]  omeprazole (PRILOSEC) 40 MG capsule Take 40 mg by mouth daily. 10/04/15  Yes [provider]  sertraline (ZOLOFT) 100 MG tablet Take 100 mg by mouth daily.    Yes [provider]  triamterene-hydrochlorothiazide (MAXZIDE-25) 37.5-25 MG tablet Take 1 tablet daily by mouth. 08/14/17  Yes [provider]  vitamin B-12 (CYANOCOBALAMIN) 500 MCG tablet Take 500 mcg by mouth daily.   Yes [provider]  escitalopram (LEXAPRO) 10 MG tablet Take 10 mg by mouth daily. Patient not taking: No sig reported 03/30/18   [provider]  insulin glargine (LANTUS) 100 UNIT/ML injection Inject 0.2 mLs (20 Units total) into the skin daily. 10/08/15   Debbe Odea, MD    Physical Exam: Vitals:   07/14/21 2300 07/14/21 2330 07/15/21 0000 07/15/21 0045  BP: (!) 128/57 116/72 103/76 121/62  Pulse: 83 85 70 87  Resp: 18 17 15 18   Temp:       TempSrc:      SpO2: 100% 99% 100% 100%  Weight:      Height:        Constitutional: NAD, calm, comfortable Vitals:   07/14/21 2300 07/14/21 2330 07/15/21 0000 07/15/21 0045  BP: (!) 128/57 116/72 103/76 121/62  Pulse: 83 85 70 87  Resp: 18 17 15 18   Temp:      TempSrc:      SpO2: 100% 99% 100% 100%  Weight:      Height:       General: WDWN, Alert and oriented x3.  Eyes: EOMI, PERRL, conjunctivae normal.  Sclera nonicteric HENT:  [place]/AT, external ears normal.  Nares patent without epistasis.  Mucous membranes are dry Neck: Soft, normal range of motion, supple, no masses, no thyromegaly.  Trachea midline Respiratory: equal breath sounds with mild diffuse rales, no wheezing, no crackles. Normal respiratory effort. No accessory muscle use.  Cardiovascular: Regular rate and rhythm, no murmurs / rubs / gallops. No extremity edema. 2+ pedal pulses Abdomen: Soft,  no tenderness, nondistended, no rebound or guarding.  No masses palpated. Bowel sounds normoactive Musculoskeletal: FROM. no cyanosis. No joint deformity upper and lower extremities. Normal muscle tone.  Skin: Warm, dry, intact no rashes, lesions, ulcers. No induration Neurologic: CN 2-12 grossly intact.  Normal speech. Sensation intact, patella DTR +1 bilaterally. Strength 5/5 in all extremities.   Psychiatric: Normal judgment and insight.  Normal mood.    Labs on Admission: I have personally reviewed following labs and imaging studies  CBC: Recent Labs  Lab 07/14/21 2250  WBC 12.8*  HGB 13.1  HCT 40.3  MCV 88.0  PLT 932    Basic Metabolic Panel: Recent Labs  Lab 07/14/21 2250 07/15/21 0041  NA 127* 128*  K 5.0 5.2*  CL 85* 88*  CO2 14* 15*  GLUCOSE 844* 809*  BUN 50* 48*  CREATININE 2.07* 2.02*  CALCIUM 9.3 9.1    GFR: Estimated Creatinine Clearance: 32.4 mL/min (A) (by C-G formula based on SCr of 2.02 mg/dL (H)).  Liver Function Tests: No results for input(s): AST, ALT, ALKPHOS, BILITOT, PROT,  ALBUMIN in the last 168 hours.  Urine analysis:    Component Value Date/Time   COLORURINE YELLOW 07/15/2021 0051   APPEARANCEUR CLEAR 07/15/2021 0051   LABSPEC 1.015 07/15/2021 0051   PHURINE 6.0 07/15/2021 0051   GLUCOSEU >1,000 (A) 07/15/2021 0051   HGBUR TRACE (A) 07/15/2021 0051   BILIRUBINUR NEGATIVE 07/15/2021 0051   KETONESUR >80 (A) 07/15/2021 0051   PROTEINUR NEGATIVE 07/15/2021 0051   NITRITE NEGATIVE 07/15/2021 0051   LEUKOCYTESUR NEGATIVE 07/15/2021 0051    Radiological Exams on Admission: DG Chest Portable 1 View  Result Date: 07/14/2021 CLINICAL DATA:  Vomiting. EXAM: PORTABLE CHEST 1 VIEW COMPARISON:  None. FINDINGS: The heart size and mediastinal contours are within normal limits. Both lungs are clear. The visualized skeletal structures are unremarkable. IMPRESSION: No active disease. Electronically Signed   By: Ronney Asters M.D.   On: 07/14/2021 23:25    EKG: Independently reviewed.  EKG shows normal sinus rhythm with no acute ST elevation or depression.  QTc 474  Assessment/Plan Principal Problem:   DKA, type 1  Carrie Mcpherson is admitted to [facility] unit on insulin infusion.  DKA protocol is initiated.  Patient was given IV fluid bolus in the emergency room and is now on IV fluid for continued hydration. electrolytes and renal function be monitored 4 hours per DKA protocol Blood sugar to be monitored closely  Active Problems:   AKI (acute kidney injury) IV fluid hydration with LR at 100 mils per hour.  Recheck electrolytes and renal function in morning with labs Suspect AKI secondary to dehydration from repeated bouts of nausea and vomiting    Essential hypertension Continue home medications of Cozaar, Maxide.  Monitor blood pressure    Tobacco abuse Nicotine patch provided to prevent withdrawal.  Patient stronger to discontinue smoking and health hazards of tobacco use reviewed    DVT prophylaxis: Heparin for DVT prophylaxis.   Code Status:   Full Code   Family Communication:  Diagnosis and plan discussed with patient. She verbalizes understanding and agrees with plan.  Further recommendations to follow as clinical indicated Disposition Plan:   Patient is from:  Home  Anticipated DC to:  Home  Anticipated DC date:  Anticipate 2 midnight or more stay in hospital  Admission status:  Inpatient  Yevonne Aline Jahzeel Poythress MD [facility]  How to contact the [facility] Attending or Consulting provider [place] or covering provider  during after hours 7P -7A, for this patient?   Check the care team in [facility] and look for a) attending/consulting [facility] provider listed and b) the [facility] team listed Log into [email or web address] and use [place]'s universal password to access. If you do not have the password, please contact the hospital operator. Locate the [facility] provider you are looking for under [facility] and page to a number that you can be directly reached. If you still have difficulty reaching the provider, please page the [facility] (Director on Call) for the Hospitalists listed on amion for assistance.  07/15/2021, 2:11 AM

## 2021-07-15 NOTE — TOC Initial Note (Signed)
Transition of Care [facility]) - Initial/Assessment Note    Patient Details  Name: Carrie Mcpherson MRN: [id number] Date of Birth: 09/26/1961  [facility]) CM/SW Contact:    Leeroy Cha, RN Phone Number: 07/15/2021, 7:33 AM  Clinical Narrative:                 60 y.o. female with medical history significant for DMT1, GERD, HTN who presents for evaluation of repeated episodes of nausea and vomiting.  She reports that on morning of 07/14/21 she developed nausea and vomiting as have repeated episodes throughout the day.  She reports she thought she had some red streaks in her vomit initially but that then stopped.  States she has not had any diarrhea but has had intermittent abdominal pain.  She has not had any fevers or chills.  She denies any cough, shortness of breath or chest pain.  She states 2 days ago she was woken up by pain in her left lower abdomen region but it has not recurred and is now resolved.  She denies any pain with urination or frequent urination.  She denies any recent travel and has had no known sick contacts.  She reports her blood sugars have been variable over the last few days and yesterday was 160.  When she arrived in the emergency room it was over 600.  She wears a insulin pump and states she has been using it as directed.  She has had DKA in the past with the last episode being in 2019 she states.    ED Course: In the emergency room she is found to be in DKA and is started on insulin infusion and IV fluids.  She has had no hematemesis in the emergency room.  Urinalysis shows no infection but does have large amount of ketones.  WBC is 12,800 hemoglobin 13.1 hematocrit 40.3 platelets 225,000, beta hydroxybutyrate greater than 8.  Sodium 127 potassium 5.0 chloride 85 bicarb 14 glucose 844 creatinine 2.07 from baseline of 0.99 BUN 50 anion gap 28 calcium 9.3.  Sodium corrected for hyperglycemia reveals a sodium of 139.  Patient placed on DKA protocol in the emergency  room.   TOC PLAN OF CARE:  To return home with self care, will need to be seen by the diabetic coordinator. Expected Discharge Plan: Home/Self Care Barriers to Discharge: Continued Medical Work up   Patient Goals and CMS Choice Patient states their goals for this hospitalization and ongoing recovery are:: toi go back home CMS Medicare.gov Compare Post Acute Care list provided to:: Patient    Expected Discharge Plan and Services Expected Discharge Plan: Home/Self Care   Discharge Planning Services: CM Consult   Living arrangements for the past 2 months: Single Family Home                                      Prior Living Arrangements/Services Living arrangements for the past 2 months: Single Family Home Lives with:: Self Patient language and need for interpreter reviewed:: Yes Do you feel safe going back to the place where you live?: Yes            Criminal Activity/Legal Involvement Pertinent to Current Situation/Hospitalization: No - Comment as needed  Activities of Daily Living Home Assistive Devices/Equipment: Dentures (specify type), Eyeglasses ADL Screening (condition at time of admission) Patient's cognitive ability adequate to safely complete daily activities?: Yes Is the  patient deaf or have difficulty hearing?: No Does the patient have difficulty seeing, even when wearing glasses/contacts?: No Does the patient have difficulty concentrating, remembering, or making decisions?: No Patient able to express need for assistance with ADLs?: Yes Does the patient have difficulty dressing or bathing?: No Independently performs ADLs?: Yes (appropriate for developmental age) Does the patient have difficulty walking or climbing stairs?: No Weakness of Legs: None Weakness of Arms/Hands: None  Permission Sought/Granted                  Emotional Assessment Appearance:: Appears stated age     Orientation: : Oriented to Self, Oriented to Place, Oriented to   Time, Oriented to Situation Alcohol / Substance Use: Not Applicable Psych Involvement: No (comment)  Admission diagnosis:  DKA, type 1 ([place]) [E10.10] Diabetic ketoacidosis without coma associated with type 1 diabetes mellitus ([place]) [E10.10] Patient Active Problem List   Diagnosis Date Noted   DKA, type 1 ([place]) 07/15/2021   Type 1 diabetes mellitus with ketoacidosis without coma ([place])    DKA (diabetic ketoacidoses) 10/07/2015   Essential hypertension 10/07/2015   GERD (gastroesophageal reflux disease) 10/07/2015   Depression 10/07/2015   AKI (acute kidney injury) ([place]) 10/07/2015   Acute pharyngitis 10/07/2015   Tobacco abuse 10/07/2015   PCP:  Kelton Pillar, Melanie Crazier, MD Pharmacy:   [place], [place] - [address] [place] [address] Phone: [phone] Fax: [phone]     Social Determinants of Health (SDOH) Interventions    Readmission Risk Interventions No flowsheet data found.

## 2021-07-15 NOTE — Plan of Care (Signed)
  Problem: Education: Goal: Ability to describe self-care measures that may prevent or decrease complications (Diabetes Survival Skills Education) will improve Outcome: Progressing   Problem: Fluid Volume: Goal: Ability to maintain a balanced intake and output will improve Outcome: Progressing

## 2021-07-15 NOTE — Progress Notes (Signed)
Patient ID: Carrie Mcpherson, female   DOB: 1961/07/04, 60 y.o.   MRN: [id number] Patient admitted early morning for nausea and vomiting and was found to be in DKA and has been started on IV fluids and insulin drip.  Patient seen and examined at bedside and plan of care discussed with her.  I have reviewed patient's medical records including this morning's H&P, current vitals, labs and medications myself.  Continue insulin drip, IV fluids and monitor BMP.  Once anion gap is closed, will transition to long-acting insulin.

## 2021-07-16 ENCOUNTER — Other Ambulatory Visit: Payer: Self-pay

## 2021-07-16 DIAGNOSIS — I10 Essential (primary) hypertension: Secondary | ICD-10-CM

## 2021-07-16 DIAGNOSIS — N179 Acute kidney failure, unspecified: Secondary | ICD-10-CM

## 2021-07-16 DIAGNOSIS — Z720 Tobacco use: Secondary | ICD-10-CM

## 2021-07-16 LAB — BASIC METABOLIC PANEL
Anion gap: 10 (ref 5–15)
BUN: 30 mg/dL — ABNORMAL HIGH (ref 8–23)
CO2: 24 mmol/L (ref 22–32)
Calcium: 8.7 mg/dL — ABNORMAL LOW (ref 8.9–10.3)
Chloride: 101 mmol/L (ref 98–111)
Creatinine, Ser: 1.19 mg/dL — ABNORMAL HIGH (ref 0.44–1.00)
GFR, Estimated: 52 mL/min — ABNORMAL LOW (ref >60)
Glucose, Bld: 49 mg/dL — ABNORMAL LOW (ref 70–99)
Potassium: 3.4 mmol/L — ABNORMAL LOW (ref 3.5–5.1)
Sodium: 135 mmol/L (ref 135–145)

## 2021-07-16 LAB — HEMOGLOBIN A1C
Hgb A1c MFr Bld: 8.6 % — ABNORMAL HIGH (ref 4.8–5.6)
Mean Plasma Glucose: 200 mg/dL

## 2021-07-16 LAB — GLUCOSE, CAPILLARY
Glucose-Capillary: 195 mg/dL — ABNORMAL HIGH (ref 70–99)
Glucose-Capillary: 45 mg/dL — ABNORMAL LOW (ref 70–99)
Glucose-Capillary: 165 mg/dL — ABNORMAL HIGH (ref 70–99)
Glucose-Capillary: 185 mg/dL — ABNORMAL HIGH (ref 70–99)

## 2021-07-16 LAB — MAGNESIUM: Magnesium: 2 mg/dL (ref 1.7–2.4)

## 2021-07-16 MED ORDER — "INSULIN SYRINGE-NEEDLE U-100 31G X 5/16"" 0.5 ML MISC"
0 refills | Status: DC
  Filled 2021-07-16: qty 100, 25d supply, fill #0

## 2021-07-16 MED ORDER — INSULIN GLARGINE 100 UNIT/ML ~~LOC~~ SOLN
20.0000 [IU] | Freq: Every day | SUBCUTANEOUS | 0 refills | Status: DC
  Filled 2021-07-16: qty 10, 28d supply, fill #0

## 2021-07-16 MED ORDER — ONDANSETRON HCL 4 MG PO TABS
4.0000 mg | ORAL_TABLET | Freq: Four times a day (QID) | ORAL | 0 refills | Status: DC | PRN
  Filled 2021-07-16: qty 20, 5d supply, fill #0

## 2021-07-16 MED ORDER — INSULIN LISPRO 100 UNIT/ML IJ SOLN
8.0000 [IU] | Freq: Three times a day (TID) | INTRAMUSCULAR | 0 refills | Status: DC
  Filled 2021-07-16: qty 10, 28d supply, fill #0

## 2021-07-16 MED ORDER — INSULIN GLARGINE-YFGN 100 UNIT/ML ~~LOC~~ SOLN
15.0000 [IU] | SUBCUTANEOUS | Status: DC
  Administered 2021-07-16: 15 [IU] via SUBCUTANEOUS
  Filled 2021-07-16: qty 0.15

## 2021-07-16 NOTE — Progress Notes (Signed)
Inpatient Diabetes Program Recommendations  AACE/ADA: New Consensus Statement on Inpatient Glycemic Control (2015)  Target Ranges:  Prepandial:   less than 140 mg/dL      Peak postprandial:   less than 180 mg/dL (1-2 hours)      Critically ill patients:  140 - 180 mg/dL   Lab Results  Component Value Date   GLUCAP 165 (H) 07/16/2021   HGBA1C 8.6 (H) 07/15/2021    Review of Glycemic Control  Diabetes history: DM1 Outpatient Diabetes medications: pump Current orders for Inpatient glycemic control: Semglee 15 units QD, Novolog 0-15 units TID with meals and 0-5 HS  To be d/ced today. TOC working with pt to obtain both Lantus and Novolog at discharge. Pt to receive a 30 day supply and will f/u with [place].  Inpatient Diabetes Program Recommendations:    For discharge:  Lantus 20 units QD Novolog 1 units for every 10 grams of carbohydrate. Pt well-versed in counting CHOs. May be written as Novolog 8 units TID and pt will count CHOs and take that amount. Also needs syringes.  Pt now feels comfortable going home. Will apply for Medicare/Medicaid and assistance from pharmaceutical companies.   Thank you. Lorenda Peck, RD, LDN, CDE Inpatient Diabetes Coordinator [phone]

## 2021-07-16 NOTE — TOC Transition Note (Signed)
Transition of Care [facility]) - CM/SW Discharge Note   Patient Details  Name: Carrie Mcpherson MRN: [id number] Date of Birth: 03-Dec-1960  Transition of Care [facility]) CM/SW Contact:  Lynnell Catalan, RN Phone Number: 07/16/2021, 11:23 AM   Clinical Narrative:     Spoke with pt for dc planning. TOC consult for medication assistance. Pt with insulin pump prior to admission and will be unable to afford it now as she has no insurance from losing her job. One time free fill for insulin granted from [facility] pharmacy. Pt informed that she would need to change her PCP if she decides to go to [facility]. She states that she is ok with that. Follow up hospital appointment made at the [place] at [facility]. MD to send insulin scripts to [facility] pharmacy. Pt knows to pick up medication there.     Barriers to Discharge: Continued Medical Work up   Patient Goals and CMS Choice Patient states their goals for this hospitalization and ongoing recovery are:: toi go back home CMS Medicare.gov Compare Post Acute Care list provided to:: Patient   Discharge Plan and Services   Discharge Planning Services: CM Consult                 Readmission Risk Interventions No flowsheet data found.

## 2021-07-16 NOTE — Progress Notes (Signed)
Subjective:    Carrie Mcpherson - 60 y.o. female MRN [id number]  Date of birth: 05/12/1961  HPI  Carrie Mcpherson is to establish care and hospital discharge follow-up.   Current issues and/or concerns: Visit 07/14/2021 - 07/16/2021 at [facility] per MD note: Recommendations for Outpatient Follow-up:  Follow up with PCP in 1 week with repeat BMP Comply with medications and follow-up Follow up in ED if symptoms worsen or new appear   Home Health: No Equipment/Devices: None   Discharge Condition: Stable CODE STATUS: Full Diet recommendation: Heart healthy/carb modified   Brief/Interim Summary: 60 year old female with history of diabetes mellitus type 1, GERD, hypertension presented with nausea and vomiting.  She was found to be in DKA and started on IV fluids and insulin drip.  During the hospitalization, DKA resolved, anion gap closed and she was transitioned to long-acting insulin.  Diet was started and gradually advanced and she has tolerated her diet.  Diabetes coordinator has evaluated the patient.  She feels better enough to go home today.  She will be discharged home on long-acting and short acting insulin.  She will need close outpatient follow-up with PCP.   Discharge Diagnoses:    DKA in a patient with diabetes mellitus type 1 -Patient was on insulin pump at home but will not be able to use it anymore because of lack of insurance and unable to afford it. -Treated with IV fluids and insulin drip. -Once anion gap closed, she was transitioned to long-acting insulin. -Diet was started and gradually advanced and she has tolerated her diet.  Diabetes coordinator has evaluated the patient.  She feels better enough to go home today.  She will be discharged home on Lantus 20 units daily and Humalog 8 units 3 times a day with meals -Had episode of hypoglycemia this morning which has already improved.  Patient feels confident and comfortable to go home today. -Carb modified  diet. -She will need close outpatient follow-up with PCP.   Acute kidney injury -From above and dehydration.  Treated with IV fluids.  Resolved.   Essential hypertension -Resume losartan on discharge.  We will hold triamterene hydrochlorothiazide.   Tobacco abuse -Patient was counseled regarding tobacco cessation  07/19/2021: DIABETES TYPE 1: Reports still feeling weak which usually happens after DKA episode. Reports before this occurrence last DKA episode was 2019. Reports using [employer] diabetic testing supplies and plans to purchase test strips on today.   Reports during hospital visit blood sugars dropped after Lantus administration. Reports the diabetic coordinator/nutritionist told her that it would be best to take 15 units of Lantus daily instead of the prescribed 20 units to avoid hypoglycemia. Reports she has increased Lantus to 17 units and doing well.   Last A1C:  8.6% on 07/16/2021 Home Monitoring?  Plans to start checking soon Diet Adherence: [x]  Yes   []  No Exercise: reports she does get outside for fresh air  HYPERTENSION: Began taking Triamterene-Hydrochlorothiazide on yesterday. Still taking Losartan. Reports she had someone make her upset prior to today's visit which is why her blood pressure is higher than normal. Currently taking: see medication list Have you taken your blood pressure medication today: []  Yes []  No  Med Adherence: [x]  Yes    []  No Medication side effects: []  Yes    [x]  No Adherence with salt restriction (low-salt diet): [x]  Yes  []  No Exercise: reports she does get outside for fresh air  Home Monitoring?: [x]  Yes    []  No  Monitoring Frequency: [x]  Yes    []  No Home BP results range: [x]  Yes, 120's-130's/70's-80's with occasional 644'I systolic Smoking [x]  Yes, smoking 10 cigarettes about every 5 days SOB? []  Yes    [x]  No Chest Pain?: []  Yes    [x]  No   ROS per HPI    Health Maintenance: Health Maintenance Due  Topic Date Due   COVID-19  Vaccine (1) Never done   FOOT EXAM  Never done   OPHTHALMOLOGY EXAM  Never done   Hepatitis C Screening  Never done   TETANUS/TDAP  Never done   Zoster Vaccines- Shingrix (1 of 2) Never done   PAP SMEAR-Modifier  01/17/2020     Past Medical History: Patient Active Problem List   Diagnosis Date Noted   DKA, type 1 ([place]) 07/15/2021   Type 1 diabetes mellitus with ketoacidosis without coma ([place]) 10/26/2019   DKA (diabetic ketoacidoses) 10/07/2015   Essential hypertension 10/07/2015   GERD (gastroesophageal reflux disease) 10/07/2015   Depression 10/07/2015   AKI (acute kidney injury) ([place]) 10/07/2015   Acute pharyngitis 10/07/2015   Tobacco abuse 10/07/2015    Social History   reports that she has been smoking cigarettes. She has a 7.50 pack-year smoking history. She has never used smokeless tobacco. She reports current alcohol use. She reports that she does not use drugs.   Family History  family history includes Breast cancer in her maternal aunt; Hypertension in an other family member.   Medications: reviewed and updated   Objective:   Physical Exam BP (!) 152/81 (BP Location: Left Arm, Patient Position: Sitting, Cuff Size: Large)   Pulse (!) 59   Temp 97.7 F (36.5 C)   Resp 16   Ht 5' 5.55" (1.665 m)   Wt 177 lb (80.3 kg)   SpO2 98%   BMI 28.96 kg/m   Physical Exam HENT:     Head: Normocephalic and atraumatic.  Eyes:     Extraocular Movements: Extraocular movements intact.     Conjunctiva/sclera: Conjunctivae normal.     Pupils: Pupils are equal, round, and reactive to light.  Cardiovascular:     Rate and Rhythm: Bradycardia present.     Pulses: Normal pulses.     Heart sounds: Normal heart sounds.  Pulmonary:     Effort: Pulmonary effort is normal.     Breath sounds: Normal breath sounds.  Musculoskeletal:     Cervical back: Normal range of motion and neck supple.  Neurological:     General: No focal deficit present.     Mental Status: She is alert  and oriented to person, place, and time.  Psychiatric:        Mood and Affect: Mood normal.        Behavior: Behavior normal.       Assessment & Plan:  1. Encounter to establish care: - Patient presents today to establish care.  - Return for annual physical examination, labs, and health maintenance. Arrive fasting meaning having no food for at least 8 hours prior to appointment. You may have only water or black coffee. Please take scheduled medications as normal.  2. Hospital discharge follow-up: - Reviewed hospital course, current medications, ensured proper follow-up in place, and addressed concerns.   3. Type 1 diabetes mellitus with ketoacidosis without coma ([place]): - Last hemoglobin A1c 8.6% on 07/16/2021.  - Continue Lantus as prescribed, no refills needed.  - Continue Humalog as prescribed, no refills needed.  - Discussed the importance of healthy eating habits,  low-carbohydrate diet, low-sugar diet, regular aerobic exercise (at least 150 minutes a week as tolerated) and medication compliance to achieve or maintain control of diabetes. - To achieve an A1C goal of less than or equal to 7.0 percent, a fasting blood sugar of 80 to 130 mg/dL and a postprandial glucose (90 to 120 minutes after a meal) less than 180 mg/dL. In the event of sugars less than 60 mg/dl or greater than 400 mg/dl please notify the clinic ASAP. It is recommended that you undergo annual eye exams and annual foot exams. - BMP to evaluate kidney function and electrolyte balance. - Follow-up with primary provider in 4 weeks or sooner if needed for repeat hemoglobin A1c.  - Basic Metabolic Panel  4. AKI (acute kidney injury) ([place]): - BMP to evaluate kidney function and electrolyte balance. - Basic Metabolic Panel  5. Essential hypertension: - Blood pressure not at goal during today's visit. Patient asymptomatic without chest pressure, chest pain, palpitations, shortness of breath, worst headache of life, and any  additional red flag symptoms. - Home blood pressures at goal. - Continue Losartan as prescribed, no refills needed.  - Continue Triamterene-Hydrochlorothiazide as prescribed, no refills needed.  - Counseled on blood pressure goal of less than 130/80, low-sodium, DASH diet, medication compliance, 150 minutes of moderate intensity exercise per week as tolerated. Discussed medication compliance, adverse effects. - BMP to evaluate kidney function and electrolyte balance. - Follow-up with primary provider in 3 months or sooner if needed.  - Basic Metabolic Panel  6. Tobacco abuse: - Counseled to quit.  Discussed health risk associated with smoking and cessation methods including NRT, Chantix, Buproprion.  - Patient reports her smoking cessation plan includes decreasing amount of cigarettes she is smoking over time. Currently smoking about 10 cigarettes every 5 days.  - Follow-up with primary provider as scheduled.    Patient was given clear instructions to go to Emergency Department or return to medical center if symptoms don't improve, worsen, or new problems develop.The patient verbalized understanding.  I discussed the assessment and treatment plan with the patient. The patient was provided an opportunity to ask questions and all were answered. The patient agreed with the plan and demonstrated an understanding of the instructions.   The patient was advised to call back or seek an in-person evaluation if the symptoms worsen or if the condition fails to improve as anticipated.    Durene Fruits, NP 07/19/2021, 4:26 PM Primary Care at [facility]

## 2021-07-16 NOTE — Discharge Summary (Signed)
Physician Discharge Summary  Carrie Mcpherson XVQ:[id number] DOB: 06-16-1961 DOA: 07/14/2021  PCP: No primary care provider on file.  Admit date: 07/14/2021 Discharge date: 07/16/2021  Admitted From: Home Disposition: Home  Recommendations for Outpatient Follow-up:  Follow up with PCP in 1 week with repeat BMP Comply with medications and follow-up Follow up in ED if symptoms worsen or new appear   Home Health: No Equipment/Devices: None  Discharge Condition: Stable CODE STATUS: Full Diet recommendation: Heart healthy/carb modified  Brief/Interim Summary: 60 year old female with history of diabetes mellitus type 1, GERD, hypertension presented with nausea and vomiting.  She was found to be in DKA and started on IV fluids and insulin drip.  During the hospitalization, DKA resolved, anion gap closed and she was transitioned to long-acting insulin.  Diet was started and gradually advanced and she has tolerated her diet.  Diabetes coordinator has evaluated the patient.  She feels better enough to go home today.  She will be discharged home on long-acting and short acting insulin.  She will need close outpatient follow-up with PCP.  Discharge Diagnoses:   DKA in a patient with diabetes mellitus type 1 -Patient was on insulin pump at home but will not be able to use it anymore because of lack of insurance and unable to afford it. -Treated with IV fluids and insulin drip. -Once anion gap closed, she was transitioned to long-acting insulin. -Diet was started and gradually advanced and she has tolerated her diet.  Diabetes coordinator has evaluated the patient.  She feels better enough to go home today.  She will be discharged home on Lantus 20 units daily and Humalog 8 units 3 times a day with meals -Had episode of hypoglycemia this morning which has already improved.  Patient feels confident and comfortable to go home today. -Carb modified diet. -She will need close outpatient follow-up with  PCP.  Acute kidney injury -From above and dehydration.  Treated with IV fluids.  Resolved.  Essential hypertension -Resume losartan on discharge.  We will hold triamterene hydrochlorothiazide.  Tobacco abuse -Patient was counseled regarding tobacco cessation   Discharge Instructions  Discharge Instructions     Diet - low sodium heart healthy   Complete by: As directed    Diet Carb Modified   Complete by: As directed    Increase activity slowly   Complete by: As directed       Allergies as of 07/16/2021       Reactions   Bactrim [sulfamethoxazole-trimethoprim] Itching        Medication List     STOP taking these medications    escitalopram 10 MG tablet Commonly known as: LEXAPRO   triamterene-hydrochlorothiazide 37.5-25 MG tablet Commonly known as: MAXZIDE-25       TAKE these medications    aspirin EC 81 MG tablet Take 81 mg by mouth daily.   atorvastatin 40 MG tablet Commonly known as: LIPITOR Take 40 mg by mouth every morning.   insulin glargine 100 UNIT/ML injection Commonly known as: LANTUS Inject 0.2 mLs (20 Units total) into the skin daily.   insulin lispro 100 UNIT/ML injection Commonly known as: HUMALOG Inject 0.08 mLs (8 Units total) into the skin 3 (three) times daily with meals. What changed:  how much to take when to take this additional instructions   INSULIN SYRINGE .5CC/31GX5/16" 31G X 5/16" 0.5 ML Misc Lantus 20 units daily and Humalog 8 units TID with meals   losartan 100 MG tablet Commonly known as: COZAAR Take 100  mg by mouth daily.   multivitamin with minerals tablet Take 1 tablet by mouth 3 (three) times a week.   omeprazole 40 MG capsule Commonly known as: PRILOSEC Take 40 mg by mouth daily.   ondansetron 4 MG tablet Commonly known as: ZOFRAN Take 1 tablet (4 mg total) by mouth every 6 (six) hours as needed for nausea.   sertraline 100 MG tablet Commonly known as: ZOLOFT Take 100 mg by mouth daily.   vitamin  B-12 500 MCG tablet Commonly known as: CYANOCOBALAMIN Take 500 mcg by mouth daily.        Follow-up Information     [facility] Go on 07/19/2021.   Why: [phone]. Appointment scheduled for 1:30pm to establish primary care. Contact information: [place], [address] [place] [address]        [facility] Follow up.   Why: Go to this pharmacy to get your insulin. Contact information: [place] [address] [phone]               Allergies  Allergen Reactions   Bactrim [Sulfamethoxazole-Trimethoprim] Itching    Consultations: None   Procedures/Studies: DG Chest Portable 1 View  Result Date: 07/14/2021 CLINICAL DATA:  Vomiting. EXAM: PORTABLE CHEST 1 VIEW COMPARISON:  None. FINDINGS: The heart size and mediastinal contours are within normal limits. Both lungs are clear. The visualized skeletal structures are unremarkable. IMPRESSION: No active disease. Electronically Signed   By: Ronney Asters M.D.   On: 07/14/2021 23:25      Subjective: Patient seen and examined at bedside.  She denies any current nausea or vomiting.  Tolerating diet.  He is okay going home today.  Discharge Exam: Vitals:   07/16/21 0421 07/16/21 0851  BP: (!) 157/85 (!) 132/59  Pulse: 65 (!) 53  Resp: 19 15  Temp: 98.2 F (36.8 C) 98.4 F (36.9 C)  SpO2: 92% 98%    General: Pt is alert, awake, not in acute distress Cardiovascular: Intermittently bradycardic, S1/S2 + Respiratory: bilateral decreased breath sounds at bases Abdominal: Soft, NT, ND, bowel sounds + Extremities: no edema, no cyanosis    The results of significant diagnostics from this hospitalization (including imaging, microbiology, ancillary and laboratory) are listed below for reference.     Microbiology: Recent Results (from the past 240 hour(s))  Resp Panel by RT-PCR (Flu A&B, Covid) Nasopharyngeal  Swab     Status: None   Collection Time: 07/15/21 12:41 AM   Specimen: Nasopharyngeal Swab; Nasopharyngeal(NP) swabs in vial transport medium  Result Value Ref Range Status   SARS Coronavirus 2 by RT PCR NEGATIVE NEGATIVE Final    Comment: (NOTE) SARS-CoV-2 target nucleic acids are NOT DETECTED.  The SARS-CoV-2 RNA is generally detectable in upper respiratory specimens during the acute phase of infection. The lowest concentration of SARS-CoV-2 viral copies this assay can detect is 138 copies/mL. A negative result does not preclude SARS-Cov-2 infection and should not be used as the sole basis for treatment or other patient management decisions. A negative result may occur with  improper specimen collection/handling, submission of specimen other than nasopharyngeal swab, presence of viral mutation(s) within the areas targeted by this assay, and inadequate number of viral copies(<138 copies/mL). A negative result must be combined with clinical observations, patient history, and epidemiological information. The expected result is Negative.  Fact Sheet for Patients:  EntrepreneurPulse.com.au  Fact Sheet for Healthcare Providers:  IncredibleEmployment.be  This test is no t  yet approved or cleared by the [place] and  has been authorized for detection and/or diagnosis of SARS-CoV-2 by FDA under an Emergency Use Authorization (EUA). This EUA will remain  in effect (meaning this test can be used) for the duration of the COVID-19 declaration under Section 564(b)(1) of the Act, 21 U.S.C.section 360bbb-3(b)(1), unless the authorization is terminated  or revoked sooner.       Influenza A by PCR NEGATIVE NEGATIVE Final   Influenza B by PCR NEGATIVE NEGATIVE Final    Comment: (NOTE) The Xpert Xpress SARS-CoV-2/FLU/RSV plus assay is intended as an aid in the diagnosis of influenza from Nasopharyngeal swab specimens and should not be used as a sole  basis for treatment. Nasal washings and aspirates are unacceptable for Xpert Xpress SARS-CoV-2/FLU/RSV testing.  Fact Sheet for Patients: EntrepreneurPulse.com.au  Fact Sheet for Healthcare Providers: IncredibleEmployment.be  This test is not yet approved or cleared by the [place] FDA and has been authorized for detection and/or diagnosis of SARS-CoV-2 by FDA under an Emergency Use Authorization (EUA). This EUA will remain in effect (meaning this test can be used) for the duration of the COVID-19 declaration under Section 564(b)(1) of the Act, 21 U.S.C. section 360bbb-3(b)(1), unless the authorization is terminated or revoked.  Performed at [facility], [place] [address] [place], [place] [address]   MRSA Next Gen by PCR, Nasal     Status: None   Collection Time: 07/15/21  2:59 AM   Specimen: Nasal Mucosa; Nasal Swab  Result Value Ref Range Status   MRSA by PCR Next Gen NOT DETECTED NOT DETECTED Final    Comment: (NOTE) The GeneXpert MRSA Assay (FDA approved for NASAL specimens only), is one component of a comprehensive MRSA colonization surveillance program. It is not intended to diagnose MRSA infection nor to guide or monitor treatment for MRSA infections. Test performance is not FDA approved in patients less than 37 years old. Performed at [facility], [place] [address], [place], [place] [address]      Labs: BNP (last 3 results) No results for input(s): BNP in the last 8760 hours. Basic Metabolic Panel: Recent Labs  Lab 07/15/21 0041 07/15/21 0302 07/15/21 0701 07/15/21 1042 07/16/21 0500  NA 128* 132* 135 134* 135  K 5.2* 4.2 3.7 3.9 3.4*  CL 88* 96* 101 99 101  CO2 15* 9* 19* 26 24  GLUCOSE 809* 608* 299* 183* 49*  BUN 48* 49* 51* 44* 30*  CREATININE 2.02* 1.95* 2.01* 1.54* 1.19*  CALCIUM 9.1 9.2 9.3 9.0 8.7*  MG  --   --   --   --  2.0   Liver Function Tests: No results for  input(s): AST, ALT, ALKPHOS, BILITOT, PROT, ALBUMIN in the last 168 hours. No results for input(s): LIPASE, AMYLASE in the last 168 hours. No results for input(s): AMMONIA in the last 168 hours. CBC: Recent Labs  Lab 07/14/21 2250 07/15/21 0701  WBC 12.8* 12.3*  HGB 13.1 11.4*  HCT 40.3 32.8*  MCV 88.0 83.5  PLT 225 185   Cardiac Enzymes: No results for input(s): CKTOTAL, CKMB, CKMBINDEX, TROPONINI in the last 168 hours. BNP: Invalid input(s): POCBNP CBG: Recent Labs  Lab 07/15/21 1649 07/15/21 2114 07/16/21 0637 07/16/21 0650 07/16/21 0755  GLUCAP 87 107* 45* 195* 165*   D-Dimer No results for input(s): DDIMER in the last 72 hours. Hgb A1c Recent Labs    07/15/21 0302  HGBA1C 8.6*   Lipid Profile No results for  input(s): CHOL, HDL, LDLCALC, TRIG, CHOLHDL, LDLDIRECT in the last 72 hours. Thyroid function studies No results for input(s): TSH, T4TOTAL, T3FREE, THYROIDAB in the last 72 hours.  Invalid input(s): FREET3 Anemia work up No results for input(s): VITAMINB12, FOLATE, FERRITIN, TIBC, IRON, RETICCTPCT in the last 72 hours. Urinalysis    Component Value Date/Time   COLORURINE YELLOW 07/15/2021 0051   APPEARANCEUR CLEAR 07/15/2021 0051   LABSPEC 1.015 07/15/2021 0051   PHURINE 6.0 07/15/2021 0051   GLUCOSEU >1,000 (A) 07/15/2021 0051   HGBUR TRACE (A) 07/15/2021 0051   BILIRUBINUR NEGATIVE 07/15/2021 0051   KETONESUR >80 (A) 07/15/2021 0051   PROTEINUR NEGATIVE 07/15/2021 0051   NITRITE NEGATIVE 07/15/2021 0051   LEUKOCYTESUR NEGATIVE 07/15/2021 0051   Sepsis Labs Invalid input(s): PROCALCITONIN,  WBC,  LACTICIDVEN Microbiology Recent Results (from the past 240 hour(s))  Resp Panel by RT-PCR (Flu A&B, Covid) Nasopharyngeal Swab     Status: None   Collection Time: 07/15/21 12:41 AM   Specimen: Nasopharyngeal Swab; Nasopharyngeal(NP) swabs in vial transport medium  Result Value Ref Range Status   SARS Coronavirus 2 by RT PCR NEGATIVE NEGATIVE Final     Comment: (NOTE) SARS-CoV-2 target nucleic acids are NOT DETECTED.  The SARS-CoV-2 RNA is generally detectable in upper respiratory specimens during the acute phase of infection. The lowest concentration of SARS-CoV-2 viral copies this assay can detect is 138 copies/mL. A negative result does not preclude SARS-Cov-2 infection and should not be used as the sole basis for treatment or other patient management decisions. A negative result may occur with  improper specimen collection/handling, submission of specimen other than nasopharyngeal swab, presence of viral mutation(s) within the areas targeted by this assay, and inadequate number of viral copies(<138 copies/mL). A negative result must be combined with clinical observations, patient history, and epidemiological information. The expected result is Negative.  Fact Sheet for Patients:  EntrepreneurPulse.com.au  Fact Sheet for Healthcare Providers:  IncredibleEmployment.be  This test is no t yet approved or cleared by the [place] FDA and  has been authorized for detection and/or diagnosis of SARS-CoV-2 by FDA under an Emergency Use Authorization (EUA). This EUA will remain  in effect (meaning this test can be used) for the duration of the COVID-19 declaration under Section 564(b)(1) of the Act, 21 U.S.C.section 360bbb-3(b)(1), unless the authorization is terminated  or revoked sooner.       Influenza A by PCR NEGATIVE NEGATIVE Final   Influenza B by PCR NEGATIVE NEGATIVE Final    Comment: (NOTE) The Xpert Xpress SARS-CoV-2/FLU/RSV plus assay is intended as an aid in the diagnosis of influenza from Nasopharyngeal swab specimens and should not be used as a sole basis for treatment. Nasal washings and aspirates are unacceptable for Xpert Xpress SARS-CoV-2/FLU/RSV testing.  Fact Sheet for Patients: EntrepreneurPulse.com.au  Fact Sheet for Healthcare  Providers: IncredibleEmployment.be  This test is not yet approved or cleared by the [place] FDA and has been authorized for detection and/or diagnosis of SARS-CoV-2 by FDA under an Emergency Use Authorization (EUA). This EUA will remain in effect (meaning this test can be used) for the duration of the COVID-19 declaration under Section 564(b)(1) of the Act, 21 U.S.C. section 360bbb-3(b)(1), unless the authorization is terminated or revoked.  Performed at [facility], [place] [address], [place], [place] [address]   MRSA Next Gen by PCR, Nasal     Status: None   Collection Time: 07/15/21  2:59 AM   Specimen: Nasal Mucosa; Nasal Swab  Result Value Ref Range Status   MRSA by PCR Next Gen NOT DETECTED NOT DETECTED Final    Comment: (NOTE) The GeneXpert MRSA Assay (FDA approved for NASAL specimens only), is one component of a comprehensive MRSA colonization surveillance program. It is not intended to diagnose MRSA infection nor to guide or monitor treatment for MRSA infections. Test performance is not FDA approved in patients less than 54 years old. Performed at [facility], [place] [address], [place], [place] [address]      Time coordinating discharge: 35 minutes  SIGNED:   Aline August, MD  [facility] 07/16/2021, 11:08 AM

## 2021-07-19 ENCOUNTER — Encounter: Payer: Self-pay | Admitting: Family

## 2021-07-19 ENCOUNTER — Ambulatory Visit (INDEPENDENT_AMBULATORY_CARE_PROVIDER_SITE_OTHER): Payer: Self-pay | Admitting: Family

## 2021-07-19 ENCOUNTER — Other Ambulatory Visit: Payer: Self-pay

## 2021-07-19 VITALS — BP 152/81 | HR 59 | Temp 97.7°F | Resp 16 | Ht 65.55 in | Wt 177.0 lb

## 2021-07-19 DIAGNOSIS — E1010 Type 1 diabetes mellitus with ketoacidosis without coma: Secondary | ICD-10-CM

## 2021-07-19 DIAGNOSIS — Z7689 Persons encountering health services in other specified circumstances: Secondary | ICD-10-CM

## 2021-07-19 DIAGNOSIS — Z720 Tobacco use: Secondary | ICD-10-CM

## 2021-07-19 DIAGNOSIS — Z09 Encounter for follow-up examination after completed treatment for conditions other than malignant neoplasm: Secondary | ICD-10-CM

## 2021-07-19 DIAGNOSIS — N179 Acute kidney failure, unspecified: Secondary | ICD-10-CM

## 2021-07-19 DIAGNOSIS — I10 Essential (primary) hypertension: Secondary | ICD-10-CM

## 2021-07-19 NOTE — Progress Notes (Signed)
Pt presents to establish care and hospitalization follow-up

## 2021-07-19 NOTE — Patient Instructions (Signed)
Thank you for choosing Primary Care at [facility] for your medical home!    Carrie Mcpherson was seen by Camillia Herter, NP today.   Carrie Mcpherson's primary care provider is Hermilo Dutter Zachery Dauer, NP.   For the best care possible,  you should try to see Durene Fruits, NP whenever you come to clinic.   We look forward to seeing you again soon!  If you have any questions about your visit today,  please call us at [phone]  Or feel free to reach your provider via [place].    Keeping you healthy   Get these tests Blood pressure- Have your blood pressure checked once a year by your healthcare provider.  Normal blood pressure is 120/80. Weight- Have your body mass index (BMI) calculated to screen for obesity.  BMI is a measure of body fat based on height and weight. You can also calculate your own BMI at GravelBags.it. Cholesterol- Have your cholesterol checked regularly starting at age 70, sooner may be necessary if you have diabetes, high blood pressure, if a family member developed heart diseases at an early age or if you smoke.  Chlamydia, HIV, and other sexual transmitted disease- Get screened each year until the age of 56 then within three months of each new sexual partner. Diabetes- Have your blood sugar checked regularly if you have high blood pressure, high cholesterol, a family history of diabetes or if you are overweight.   Get these vaccines Flu shot- Every fall. Tetanus shot- Every 10 years. Menactra- Single dose; prevents meningitis.   Take these steps Don't smoke- If you do smoke, ask your healthcare provider about quitting. For tips on how to quit, go to www.smokefree.gov or call 1-800-QUIT-NOW. Be physically active- Exercise 5 days a week for at least 30 minutes.  If you are not already physically active start slow and gradually work up to 30 minutes of moderate physical activity.  Examples of moderate activity include walking briskly, mowing the yard, dancing,  swimming bicycling, etc. Eat a healthy diet- Eat a variety of healthy foods such as fruits, vegetables, low fat milk, low fat cheese, yogurt, lean meats, poultry, fish, beans, tofu, etc.  For more information on healthy eating, go to www.thenutritionsource.org Drink alcohol in moderation- Limit alcohol intake two drinks or less a day.  Never drink and drive. Dentist- Brush and floss teeth twice daily; visit your dentis twice a year. Depression-Your emotional health is as important as your physical health.  If you're feeling down, losing interest in things you normally enjoy please talk with your healthcare provider. Gun Safety- If you keep a gun in your home, keep it unloaded and with the safety lock on.  Bullets should be stored separately. Helmet use- Always wear a helmet when riding a motorcycle, bicycle, rollerblading or skateboarding. Safe sex- If you may be exposed to a sexually transmitted infection, use a condom Seat belts- Seat bels can save your life; always wear one. Smoke/Carbon Monoxide detectors- These detectors need to be installed on the appropriate level of your home.  Replace batteries at least once a year. Skin Cancer- When out in the sun, cover up and use sunscreen SPF 15 or higher. Violence- If anyone is threatening or hurting you, please tell your healthcare provider.

## 2021-07-20 ENCOUNTER — Other Ambulatory Visit: Payer: Self-pay | Admitting: Family

## 2021-07-20 DIAGNOSIS — N1831 Chronic kidney disease, stage 3a: Secondary | ICD-10-CM

## 2021-07-20 DIAGNOSIS — N189 Chronic kidney disease, unspecified: Secondary | ICD-10-CM | POA: Insufficient documentation

## 2021-07-20 LAB — BASIC METABOLIC PANEL
BUN/Creatinine Ratio: 18 (ref 12–28)
BUN: 23 mg/dL (ref 8–27)
CO2: 24 mmol/L (ref 20–29)
Calcium: 9.7 mg/dL (ref 8.7–10.3)
Chloride: 95 mmol/L — ABNORMAL LOW (ref 96–106)
Creatinine, Ser: 1.28 mg/dL — ABNORMAL HIGH (ref 0.57–1.00)
Glucose: 343 mg/dL — ABNORMAL HIGH (ref 70–99)
Potassium: 4.7 mmol/L (ref 3.5–5.2)
Sodium: 136 mmol/L (ref 134–144)
eGFR: 47 mL/min/{1.73_m2} — ABNORMAL LOW (ref >59)

## 2021-07-20 NOTE — Progress Notes (Signed)
Kidney function lower than normal. Referral to Nephrology for further evaluation and management. Their office should within 2 weeks with appointment details.

## 2021-07-22 ENCOUNTER — Ambulatory Visit: Payer: Self-pay

## 2021-07-30 ENCOUNTER — Ambulatory Visit: Payer: Self-pay | Attending: Family Medicine

## 2021-07-30 ENCOUNTER — Other Ambulatory Visit: Payer: Self-pay

## 2021-08-09 ENCOUNTER — Telehealth: Payer: Self-pay | Admitting: *Deleted

## 2021-08-09 NOTE — Telephone Encounter (Signed)
Summary: Call back Carrie Mcpherson)  Reason for CRM: Pt called requesting to speak to [facility] regarding her paperwork, she has her bank statement for July and august. She just wanted to make sure he received her email of her tax return form.   Best contact: [phone]

## 2021-08-10 NOTE — Telephone Encounter (Signed)
I call the Pt, she will come to the office to drop up the missing documents

## 2021-08-12 ENCOUNTER — Other Ambulatory Visit: Payer: Self-pay

## 2021-08-12 ENCOUNTER — Other Ambulatory Visit: Payer: Self-pay | Admitting: Family

## 2021-08-12 NOTE — Progress Notes (Signed)
Patient ID: Carrie Mcpherson, female    DOB: [birth year]  MRN: [id number]  CC: Diabetes Type 1 Follow-Up   Subjective: Carrie Mcpherson is a 60 y.o. female who presents for diabetes type 1 follow-up.   Her concerns today include:   DIABETES TYPE 1 FOLLOW-UP: 07/19/2021: - Last hemoglobin A1c 8.6% on 07/16/2021.  - Continue Lantus as prescribed, no refills needed.  - Continue Humalog as prescribed, no refills needed.   08/17/2021: Taking Lantus 25 units daily as prescribed and doing well. Also, doing well on Humalog. Was seeing Endocrinology in the past before health insurance no longer covered.   2.HYPERTENSION FOLLOW-UP: Recent visit on 07/19/2021. Doing well. Requesting refills of Losartan and Triamterene-Hydrochlorothiazide.   3. HYPERLIPIDEMIA FOLLOW-UP: Doing well on current Atorvastatin, requesting refills.   4. ACID REFLUX: Doing well on current Omeprazol, requesting refills.   5. ANXIETY DEPRESSION: Doing well on current Sertraline, requesting refills.   6. OVER-THE-COUNTER MEDICATIONS: Taking Aspirin for prevention of heart attack/stroke, multivitamin, and vitamin B12 over-the-counter.   7. FINANCIAL DIFFICULTIES: Has appointment with Carrie Mcpherson financial counselor soon. Was told she currently has only dental coverage. With pending application hoping to get approved for health and dental.  Depression screen [name] 2/9 08/17/2021 07/19/2021 12/18/2015  Decreased Interest 0 0 0  Down, Depressed, Hopeless 0 0 0  PHQ - 2 Score 0 0 0  Altered sleeping 0 - -  Tired, decreased energy 0 - -  Change in appetite 0 - -  Feeling bad or failure about yourself  0 - -  Trouble concentrating 0 - -  Moving slowly or fidgety/restless 0 - -  Suicidal thoughts 0 - -  PHQ-9 Score 0 - -  Difficult doing work/chores Not difficult at all - -     Patient Active Problem List   Diagnosis Date Noted   CKD (chronic kidney disease) 07/20/2021   DKA, type 1 ([place]) 07/15/2021   Type 1 diabetes  mellitus with ketoacidosis without coma ([place]) 10/26/2019   DKA (diabetic ketoacidoses) 10/07/2015   Essential hypertension 10/07/2015   GERD (gastroesophageal reflux disease) 10/07/2015   Depression 10/07/2015   AKI (acute kidney injury) ([place]) 10/07/2015   Acute pharyngitis 10/07/2015   Tobacco abuse 10/07/2015     Current Outpatient Medications on File Prior to Visit  Medication Sig Dispense Refill   aspirin EC 81 MG tablet Take 81 mg by mouth daily.     insulin glargine (LANTUS) 100 UNIT/ML injection Inject 0.2 mLs (20 Units total) into the skin daily. 10 mL 0   insulin lispro (HUMALOG) 100 UNIT/ML injection Inject 0.08 mLs (8 Units total) into the skin 3 (three) times daily with meals. 10 mL 0   Insulin Syringe-Needle U-100 31G X 5/16" 0.5 ML MISC USE TO INJECT Lantus 20 units daily and Humalog 8 units 3 TIMES DAILY with meals 100 each 0   Multiple Vitamins-Minerals (MULTIVITAMIN WITH MINERALS) tablet Take 1 tablet by mouth 3 (three) times a week.      ondansetron (ZOFRAN) 4 MG tablet Take 1 tablet (4 mg total) by mouth every 6 (six) hours as needed for nausea. 20 tablet 0   vitamin B-12 (CYANOCOBALAMIN) 500 MCG tablet Take 500 mcg by mouth daily.     No current facility-administered medications on file prior to visit.    Allergies  Allergen Reactions   Bactrim [Sulfamethoxazole-Trimethoprim] Itching    Social History   Socioeconomic History   Marital status: Single    Spouse name: Not on  file   Number of children: Not on file   Years of education: Not on file   Highest education level: Not on file  Occupational History   Not on file  Tobacco Use   Smoking status: Every Day    Packs/day: 0.25    Years: 30.00    Pack years: 7.50    Types: Cigarettes   Smokeless tobacco: Never  Vaping Use   Vaping Use: Never used  Substance and Sexual Activity   Alcohol use: Yes    Comment: occasional   Drug use: No   Sexual activity: Not on file  Other Topics Concern   Not on  file  Social History Narrative   Not on file   Social Determinants of Health   Financial Resource Strain: Not on file  Food Insecurity: Not on file  Transportation Needs: Not on file  Physical Activity: Not on file  Stress: Not on file  Social Connections: Not on file  Intimate Partner Violence: Not on file    Family History  Problem Relation Age of Onset   Hypertension Other    Breast cancer Maternal Aunt     Past Surgical History:  Procedure Laterality Date   COLONOSCOPY WITH PROPOFOL N/A 08/12/2014   Procedure: COLONOSCOPY WITH PROPOFOL;  Surgeon: Garlan Fair, MD;  Location: [facility];  Service: Endoscopy;  Laterality: N/A;   ROTATOR CUFF REPAIR Bilateral right 2004, left 1999    ROS: Review of Systems Negative except as stated above  PHYSICAL EXAM: BP 127/81   Pulse 64   Ht 5' 5.5" (1.664 m)   Wt 179 lb (81.2 kg)   SpO2 99%   BMI 29.33 kg/m   Physical Exam HENT:     Head: Normocephalic and atraumatic.  Eyes:     Extraocular Movements: Extraocular movements intact.     Conjunctiva/sclera: Conjunctivae normal.     Pupils: Pupils are equal, round, and reactive to light.  Cardiovascular:     Rate and Rhythm: Normal rate and regular rhythm.     Pulses: Normal pulses.     Heart sounds: Normal heart sounds.  Pulmonary:     Effort: Pulmonary effort is normal.     Breath sounds: Normal breath sounds.  Musculoskeletal:     Cervical back: Normal range of motion and neck supple.  Neurological:     General: No focal deficit present.     Mental Status: She is alert and oriented to person, place, and time.  Psychiatric:        Mood and Affect: Mood normal.        Behavior: Behavior normal.    Results for orders placed or performed in visit on 08/17/21  POCT glycosylated hemoglobin (Hb A1C)  Result Value Ref Range   Hemoglobin A1C 8.5 (A) 4.0 - 5.6 %   HbA1c POC (<> result, manual entry)     HbA1c, POC (prediabetic range)     HbA1c, POC (controlled  diabetic range)      ASSESSMENT AND PLAN: 1. Type 1 diabetes mellitus with ketoacidosis without coma ([name]): - Hemoglobin A1c today not at goal at 8.5%, goal < 7%. This is about the same as previous 8.6% on 07/15/2021. - Increase Insulin Glargine from 25 units daily to 30 units daily.  - Continue Insulin Lispro as prescribed.  - Discussed the importance of healthy eating habits, low-carbohydrate diet, low-sugar diet, regular aerobic exercise (at least 150 minutes a week as tolerated) and medication compliance to achieve or maintain control  of diabetes. - Referral to Endocrinology for further evaluation and management.  - Follow-up with primary provider as scheduled.  - POCT glycosylated hemoglobin (Hb A1C) - insulin glargine (LANTUS) 100 UNIT/ML injection; Inject 0.3 mLs (30 Units total) into the skin daily.  Dispense: 27 mL; Refill: 0 - insulin lispro (HUMALOG) 100 UNIT/ML injection; Inject 0.08 mLs (8 Units total) into the skin 3 (three) times daily with meals.  Dispense: 24 mL; Refill: 0 - Insulin Syringe-Needle U-100 31G X 5/16" 0.5 ML MISC; USE TO INJECT Lantus 20 units daily and Humalog 8 units 3 TIMES DAILY with meals  Dispense: 100 each; Refill: 1 - Ambulatory referral to Endocrinology  2. Essential hypertension: - Continue Losartan as prescribed.  - Continue Triamterene-Hydrochlorothiazide as prescribed.  - Counseled on blood pressure goal of less than 130/80, low-sodium, DASH diet, medication compliance, 150 minutes of moderate intensity exercise per week as tolerated. Discussed medication compliance, adverse effects. - Follow-up with primary provider in 3 months or sooner if needed.  - losartan (COZAAR) 100 MG tablet; Take 1 tablet (100 mg total) by mouth daily.  Dispense: 90 tablet; Refill: 0 - triamterene-hydrochlorothiazide (MAXZIDE-25) 37.5-25 MG tablet; Take 1 tablet by mouth daily.  Dispense: 90 tablet; Refill: 0  3. Hyperlipidemia, unspecified hyperlipidemia type: -Practice  low-fat heart healthy diet and at least 150 minutes of moderate intensity exercise weekly as tolerated.  - Continue Atorvastatin as prescribed. - Follow-up with primary provider as scheduled.  - atorvastatin (LIPITOR) 40 MG tablet; Take 1 tablet (40 mg total) by mouth every morning.  Dispense: 90 tablet; Refill: 0  4. Gastroesophageal reflux disease, unspecified whether esophagitis present: - Continue Omeprazole as prescribed. - Follow-up with primary provider as scheduled.  - omeprazole (PRILOSEC) 40 MG capsule; Take 1 capsule (40 mg total) by mouth daily.  Dispense: 90 capsule; Refill: 0  5. Anxiety and depression: - Patient denies thoughts of self-harm, suicidal ideations, homicidal ideations. - Continue Sertraline as prescribed.  - Follow-up with primary provider in 3 months or sooner if needed.  - sertraline (ZOLOFT) 100 MG tablet; Take 1 tablet (100 mg total) by mouth daily for 90 doses.  Dispense: 90 tablet; Refill: 0  6. Financial difficulties: - Encouraged to keep appointment with financial counselor. Patient agreeable.     Patient was given the opportunity to ask questions.  Patient verbalized understanding of the plan and was able to repeat key elements of the plan. Patient was given clear instructions to go to Emergency Department or return to medical center if symptoms don't improve, worsen, or new problems develop.The patient verbalized understanding.   Orders Placed This Encounter  Procedures   POCT glycosylated hemoglobin (Hb A1C)     Requested Prescriptions   Signed Prescriptions Disp Refills   atorvastatin (LIPITOR) 40 MG tablet 90 tablet 0    Sig: Take 1 tablet (40 mg total) by mouth every morning.   losartan (COZAAR) 100 MG tablet 90 tablet 0    Sig: Take 1 tablet (100 mg total) by mouth daily.   omeprazole (PRILOSEC) 40 MG capsule 90 capsule 0    Sig: Take 1 capsule (40 mg total) by mouth daily.   sertraline (ZOLOFT) 100 MG tablet 90 tablet 0    Sig: Take  1 tablet (100 mg total) by mouth daily for 90 doses.   triamterene-hydrochlorothiazide (MAXZIDE-25) 37.5-25 MG tablet 90 tablet 0    Sig: Take 1 tablet by mouth daily.    No follow-ups on file.  Camillia Herter, NP

## 2021-08-16 ENCOUNTER — Other Ambulatory Visit: Payer: Self-pay

## 2021-08-17 ENCOUNTER — Encounter: Payer: Self-pay | Admitting: Family

## 2021-08-17 ENCOUNTER — Ambulatory Visit (INDEPENDENT_AMBULATORY_CARE_PROVIDER_SITE_OTHER): Payer: Self-pay | Admitting: Family

## 2021-08-17 ENCOUNTER — Other Ambulatory Visit: Payer: Self-pay

## 2021-08-17 VITALS — BP 127/81 | HR 64 | Ht 65.5 in | Wt 179.0 lb

## 2021-08-17 DIAGNOSIS — F32A Depression, unspecified: Secondary | ICD-10-CM

## 2021-08-17 DIAGNOSIS — E785 Hyperlipidemia, unspecified: Secondary | ICD-10-CM

## 2021-08-17 DIAGNOSIS — K219 Gastro-esophageal reflux disease without esophagitis: Secondary | ICD-10-CM

## 2021-08-17 DIAGNOSIS — Z599 Problem related to housing and economic circumstances, unspecified: Secondary | ICD-10-CM

## 2021-08-17 DIAGNOSIS — I10 Essential (primary) hypertension: Secondary | ICD-10-CM

## 2021-08-17 DIAGNOSIS — E1010 Type 1 diabetes mellitus with ketoacidosis without coma: Secondary | ICD-10-CM

## 2021-08-17 DIAGNOSIS — F419 Anxiety disorder, unspecified: Secondary | ICD-10-CM

## 2021-08-17 LAB — POCT GLYCOSYLATED HEMOGLOBIN (HGB A1C): Hemoglobin A1C: 8.5 % — AB (ref 4.0–5.6)

## 2021-08-17 MED ORDER — ATORVASTATIN CALCIUM 40 MG PO TABS
40.0000 mg | ORAL_TABLET | ORAL | 0 refills | Status: DC
  Filled 2021-08-17: qty 30, 30d supply, fill #0
  Filled 2021-10-01: qty 30, 30d supply, fill #1
  Filled 2021-11-02: qty 30, 30d supply, fill #2
  Filled 2021-11-02 (×2): qty 30, 30d supply, fill #0

## 2021-08-17 MED ORDER — LOSARTAN POTASSIUM 100 MG PO TABS
100.0000 mg | ORAL_TABLET | Freq: Every day | ORAL | 0 refills | Status: DC
Start: 2021-08-17 — End: 2021-11-23
  Filled 2021-08-17: qty 30, 30d supply, fill #0
  Filled 2021-10-01: qty 30, 30d supply, fill #1
  Filled 2021-11-02: qty 30, 30d supply, fill #2
  Filled 2021-11-02: qty 30, 30d supply, fill #0

## 2021-08-17 MED ORDER — SERTRALINE HCL 100 MG PO TABS
100.0000 mg | ORAL_TABLET | Freq: Every day | ORAL | 0 refills | Status: DC
  Filled 2021-08-17: qty 30, 30d supply, fill #0
  Filled 2021-10-01: qty 30, 30d supply, fill #1
  Filled 2021-11-02: qty 30, 30d supply, fill #2
  Filled 2021-11-02: qty 30, 30d supply, fill #0

## 2021-08-17 MED ORDER — TRIAMTERENE-HCTZ 37.5-25 MG PO TABS
1.0000 | ORAL_TABLET | Freq: Every day | ORAL | 0 refills | Status: DC
  Filled 2021-08-17: qty 30, 30d supply, fill #0

## 2021-08-17 MED ORDER — OMEPRAZOLE 40 MG PO CPDR
40.0000 mg | DELAYED_RELEASE_CAPSULE | Freq: Every day | ORAL | 0 refills | Status: DC
Start: 2021-08-17 — End: 2022-01-03
  Filled 2021-08-17: qty 30, 30d supply, fill #0
  Filled 2021-10-01: qty 30, 30d supply, fill #1
  Filled 2021-11-02: qty 30, 30d supply, fill #2
  Filled 2021-11-02: qty 30, 30d supply, fill #0

## 2021-08-17 MED ORDER — "INSULIN SYRINGE-NEEDLE U-100 31G X 5/16"" 0.5 ML MISC"
1 refills | Status: DC
  Filled 2021-08-17 – 2022-07-12 (×2): qty 100, 25d supply, fill #0

## 2021-08-17 MED ORDER — INSULIN GLARGINE 100 UNIT/ML ~~LOC~~ SOLN
30.0000 [IU] | Freq: Every day | SUBCUTANEOUS | 0 refills | Status: DC
  Filled 2021-08-17: qty 10, 33d supply, fill #0
  Filled 2021-09-15: qty 10, 28d supply, fill #1

## 2021-08-17 MED ORDER — INSULIN LISPRO 100 UNIT/ML IJ SOLN
8.0000 [IU] | Freq: Three times a day (TID) | INTRAMUSCULAR | 0 refills | Status: DC
Start: 2021-08-17 — End: 2021-10-27
  Filled 2021-08-17: qty 10, 28d supply, fill #0
  Filled 2021-09-15: qty 10, 28d supply, fill #1

## 2021-08-17 NOTE — Progress Notes (Signed)
Pt wanting to talk about medication refill. A1C is 8.5

## 2021-08-18 ENCOUNTER — Other Ambulatory Visit: Payer: Self-pay

## 2021-09-15 ENCOUNTER — Other Ambulatory Visit: Payer: Self-pay

## 2021-09-20 ENCOUNTER — Other Ambulatory Visit: Payer: Self-pay

## 2021-10-01 ENCOUNTER — Other Ambulatory Visit: Payer: Self-pay

## 2021-10-05 ENCOUNTER — Other Ambulatory Visit: Payer: Self-pay

## 2021-10-06 ENCOUNTER — Other Ambulatory Visit: Payer: Self-pay

## 2021-10-11 NOTE — Progress Notes (Deleted)
Patient ID: Carrie Mcpherson, female    DOB: 1961-09-24  MRN: [id number]  CC: Annual Physical Exam   Subjective: Carrie Mcpherson is a 60 y.o. female who presents for annual physical exam.   Her concerns today include:   NEED DM EYE EXAM  NEED SHINGRIX  NEED TDAP   UPDATE ON FINANCIAL COUNSELOR APPT, NEED REFER OUT FOR T1DM  PER [name] 08/17/2021 Placed in [facility] [address] [place] [address] Ph# [phone]   Fax [phone]   HTN  - Continue Losartan as prescribed.  - Continue Triamterene-Hydrochlorothiazide as prescribed.   Patient Active Problem List   Diagnosis Date Noted   CKD (chronic kidney disease) 07/20/2021   DKA, type 1 ([place]) 07/15/2021   Type 1 diabetes mellitus with ketoacidosis without coma ([place]) 10/26/2019   DKA (diabetic ketoacidoses) 10/07/2015   Essential hypertension 10/07/2015   GERD (gastroesophageal reflux disease) 10/07/2015   Depression 10/07/2015   AKI (acute kidney injury) ([place]) 10/07/2015   Acute pharyngitis 10/07/2015   Tobacco abuse 10/07/2015     Current Outpatient Medications on File Prior to Visit  Medication Sig Dispense Refill   aspirin EC 81 MG tablet Take 81 mg by mouth daily.     atorvastatin (LIPITOR) 40 MG tablet Take 1 tablet (40 mg total) by mouth every morning. 90 tablet 0   insulin glargine (LANTUS) 100 UNIT/ML injection Inject 0.3 mLs (30 Units total) into the skin daily. 27 mL 0   insulin lispro (HUMALOG) 100 UNIT/ML injection Inject 0.08 mLs (8 Units total) into the skin 3 (three) times daily with meals. 24 mL 0   Insulin Syringe-Needle U-100 31G X 5/16" 0.5 ML MISC USE TO INJECT Lantus 20 units daily and Humalog 8 units 3 TIMES DAILY with meals 100 each 1   losartan (COZAAR) 100 MG tablet Take 1 tablet (100 mg total) by mouth daily. 90 tablet 0   Multiple Vitamins-Minerals (MULTIVITAMIN WITH MINERALS) tablet Take 1 tablet by mouth 3 (three) times a week.      omeprazole (PRILOSEC) 40 MG  capsule Take 1 capsule (40 mg total) by mouth daily. 90 capsule 0   ondansetron (ZOFRAN) 4 MG tablet Take 1 tablet (4 mg total) by mouth every 6 (six) hours as needed for nausea. 20 tablet 0   sertraline (ZOLOFT) 100 MG tablet Take 1 tablet (100 mg total) by mouth daily for 90 doses. 90 tablet 0   triamterene-hydrochlorothiazide (MAXZIDE-25) 37.5-25 MG tablet Take 1 tablet by mouth daily. 90 tablet 0   vitamin B-12 (CYANOCOBALAMIN) 500 MCG tablet Take 500 mcg by mouth daily.     No current facility-administered medications on file prior to visit.    Allergies  Allergen Reactions   Bactrim [Sulfamethoxazole-Trimethoprim] Itching    Social History   Socioeconomic History   Marital status: Single    Spouse name: Not on file   Number of children: Not on file   Years of education: Not on file   Highest education level: Not on file  Occupational History   Not on file  Tobacco Use   Smoking status: Every Day    Packs/day: 0.25    Years: 30.00    Pack years: 7.50    Types: Cigarettes   Smokeless tobacco: Never  Vaping Use   Vaping Use: Never used  Substance and Sexual Activity   Alcohol use: Yes    Comment: occasional   Drug use: No   Sexual activity: Not on file  Other Topics Concern   Not on file  Social History Narrative   Not on file   Social Determinants of Health   Financial Resource Strain: Not on file  Food Insecurity: Not on file  Transportation Needs: Not on file  Physical Activity: Not on file  Stress: Not on file  Social Connections: Not on file  Intimate Partner Violence: Not on file    Family History  Problem Relation Age of Onset   Hypertension Other    Breast cancer Maternal Aunt     Past Surgical History:  Procedure Laterality Date   COLONOSCOPY WITH PROPOFOL N/A 08/12/2014   Procedure: COLONOSCOPY WITH PROPOFOL;  Surgeon: Garlan Fair, MD;  Location: [facility];  Service: Endoscopy;  Laterality: N/A;   ROTATOR CUFF REPAIR Bilateral right  2004, left 1999    ROS: Review of Systems Negative except as stated above  PHYSICAL EXAM: There were no vitals taken for this visit.  Physical Exam  {female adult master:310786} {female adult master:310785}  CMP Latest Ref Rng & Units 07/19/2021 07/16/2021 07/15/2021  Glucose 70 - 99 mg/dL 343(H) 49(L) 183(H)  BUN 8 - 27 mg/dL 23 30(H) 44(H)  Creatinine 0.57 - 1.00 mg/dL 1.28(H) 1.19(H) 1.54(H)  Sodium 134 - 144 mmol/L 136 135 134(L)  Potassium 3.5 - 5.2 mmol/L 4.7 3.4(L) 3.9  Chloride 96 - 106 mmol/L 95(L) 101 99  CO2 20 - 29 mmol/L 24 24 26   Calcium 8.7 - 10.3 mg/dL 9.7 8.7(L) 9.0  Total Protein 6.5 - 8.1 g/dL - - -  Total Bilirubin 0.3 - 1.2 mg/dL - - -  Alkaline Phos 38 - 126 U/L - - -  AST 15 - 41 U/L - - -  ALT 14 - 54 U/L - - -   Lipid Panel     Component Value Date/Time   CHOL 149 11/18/2010 2042   TRIG 117 11/18/2010 2042   HDL 47 11/18/2010 2042   CHOLHDL 3.2 Ratio 11/18/2010 2042   VLDL 23 11/18/2010 2042   LDLCALC 79 11/18/2010 2042    CBC    Component Value Date/Time   WBC 12.3 (H) 07/15/2021 0701   RBC 3.93 07/15/2021 0701   HGB 11.4 (L) 07/15/2021 0701   HCT 32.8 (L) 07/15/2021 0701   PLT 185 07/15/2021 0701   MCV 83.5 07/15/2021 0701   MCH 29.0 07/15/2021 0701   MCHC 34.8 07/15/2021 0701   RDW 13.2 07/15/2021 0701   LYMPHSABS 0.8 10/07/2015 0500   MONOABS 0.3 10/07/2015 0500   EOSABS 0.0 10/07/2015 0500   BASOSABS 0.0 10/07/2015 0500    ASSESSMENT AND PLAN:  There are no diagnoses linked to this encounter.   Patient was given the opportunity to ask questions.  Patient verbalized understanding of the plan and was able to repeat key elements of the plan. Patient was given clear instructions to go to Emergency Department or return to medical center if symptoms don't improve, worsen, or new problems develop.The patient verbalized understanding.   No orders of the defined types were placed in this encounter.    Requested Prescriptions     No prescriptions requested or ordered in this encounter    No follow-ups on file.  Camillia Herter, NP

## 2021-10-19 ENCOUNTER — Encounter: Payer: Self-pay | Admitting: Family

## 2021-10-27 ENCOUNTER — Other Ambulatory Visit: Payer: Self-pay

## 2021-10-27 ENCOUNTER — Telehealth: Payer: Self-pay | Admitting: Family

## 2021-10-27 DIAGNOSIS — E1010 Type 1 diabetes mellitus with ketoacidosis without coma: Secondary | ICD-10-CM

## 2021-10-27 MED ORDER — INSULIN LISPRO 100 UNIT/ML IJ SOLN
8.0000 [IU] | Freq: Three times a day (TID) | INTRAMUSCULAR | 0 refills | Status: DC
  Filled 2021-10-27 – 2021-10-28 (×2): qty 10, 28d supply, fill #0
  Filled 2021-12-02 (×2): qty 10, 28d supply, fill #1

## 2021-10-27 MED ORDER — INSULIN GLARGINE 100 UNIT/ML ~~LOC~~ SOLN
30.0000 [IU] | Freq: Every day | SUBCUTANEOUS | 0 refills | Status: DC
  Filled 2021-10-27: qty 10, 28d supply, fill #0
  Filled 2021-12-02 (×2): qty 10, 28d supply, fill #1
  Filled 2022-01-03: qty 10, 28d supply, fill #2

## 2021-10-27 NOTE — Telephone Encounter (Signed)
Rx #: [id number]  insulin lispro (HUMALOG) 100 UNIT/ML injection [[id number]]   Rx #: [id number]  insulin glargine (LANTUS) 100 UNIT/ML injection [[id number]]   PLEASE SEND TO [facility] ON [address].   Pt has enough until satuday for both meds

## 2021-10-27 NOTE — Telephone Encounter (Signed)
Medication request completed  °

## 2021-10-28 ENCOUNTER — Other Ambulatory Visit (HOSPITAL_COMMUNITY): Payer: Self-pay

## 2021-11-02 ENCOUNTER — Other Ambulatory Visit: Payer: Self-pay

## 2021-11-18 NOTE — Progress Notes (Signed)
Patient ID: Carrie Mcpherson, female    DOB: 1961/04/07  MRN: [id number]  CC: Annual Physical Exam  Subjective: Carrie Mcpherson is a 61 y.o. female who presents for annual physical exam.   Her concerns today include:   HYPERTENSION FOLLOW-UP: 08/17/2021: - Continue Losartan as prescribed.  - Continue Triamterene-Hydrochlorothiazide as prescribed.   11/23/2021: Doing well on current regimen. No side effects. No issues/concerns. Denies chest pain and shortness of breath.   2. DIABETES TYPE 1 FOLLOW-UP: Reports since last appointment has not established with Endocrinology. Reports she was unaware that she was referred to Endocrinology. Has concerns because there are only certain doctors that accept the [employer]. When she had health insurance was seeing endocrinologist Delrae Rend, MD at [place] (was on insulin pump at that time). Would like referral back to him if he accepts [employer].   Still taking Lantus. Since last appointment she adjusted dosing on Humalog. Reports taking 5 to 8 units Humalog according to her food intake. Had EMT come to her home 3 times within the last two months related to low blood sugars. Reports recalls a blood sugar of 45. Other times blood sugars 80'-110's in the morning and 150's-170's during lunch/dinner. Thinks highs and lows of blood sugars may be related responsibility of caring for her mother who recently injured herself after falling and subsequently had surgeries. Prefers to wait for diabetic eye exam when W2 form arrives.   3. 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 Given Doxycyline in the past for bump on nose, requesting refill. Endorses she picks with it which causes irritation.   4. CKD FOLLOW-UP: Seen by Nephrology on yesterday. Next appointment in 8 months.  Patient Active Problem List   Diagnosis Date Noted   CKD (chronic kidney disease) 07/20/2021   DKA, type 1 ([place]) 07/15/2021   Type 1 diabetes mellitus  with ketoacidosis without coma ([place]) 10/26/2019   DKA (diabetic ketoacidoses) 10/07/2015   Essential hypertension 10/07/2015   GERD (gastroesophageal reflux disease) 10/07/2015   Depression 10/07/2015   AKI (acute kidney injury) ([place]) 10/07/2015   Acute pharyngitis 10/07/2015   Tobacco abuse 10/07/2015     Current Outpatient Medications on File Prior to Visit  Medication Sig Dispense Refill   aspirin EC 81 MG tablet Take 81 mg by mouth daily.     atorvastatin (LIPITOR) 40 MG tablet Take 1 tablet (40 mg total) by mouth every morning. 90 tablet 0   insulin glargine (LANTUS) 100 UNIT/ML injection Inject 0.3 mLs (30 Units total) into the skin daily. 27 mL 0   insulin lispro (HUMALOG) 100 UNIT/ML injection Inject 0.08 mLs (8 Units total) into the skin 3 (three) times daily with meals. 20 mL 0   Insulin Syringe-Needle U-100 31G X 5/16" 0.5 ML MISC USE TO INJECT Lantus 20 units daily and Humalog 8 units 3 TIMES DAILY with meals 100 each 1   Multiple Vitamins-Minerals (MULTIVITAMIN WITH MINERALS) tablet Take 1 tablet by mouth 3 (three) times a week.      omeprazole (PRILOSEC) 40 MG capsule Take 1 capsule (40 mg total) by mouth daily. 90 capsule 0   ondansetron (ZOFRAN) 4 MG tablet Take 1 tablet (4 mg total) by mouth every 6 (six) hours as needed for nausea. 20 tablet 0   sertraline (ZOLOFT) 100 MG tablet Take 1 tablet (100 mg total) by mouth daily for 90 doses. 90 tablet 0   vitamin B-12 (CYANOCOBALAMIN) 500 MCG tablet Take 500 mcg  by mouth daily.     No current facility-administered medications on file prior to visit.    Allergies  Allergen Reactions   Bactrim [Sulfamethoxazole-Trimethoprim] Itching    Social History   Socioeconomic History   Marital status: Single    Spouse name: Not on file   Number of children: Not on file   Years of education: Not on file   Highest education level: Not on file  Occupational History   Not on file  Tobacco Use   Smoking status: Every Day     Packs/day: 0.25    Years: 30.00    Pack years: 7.50    Types: Cigarettes   Smokeless tobacco: Never  Vaping Use   Vaping Use: Never used  Substance and Sexual Activity   Alcohol use: Yes    Comment: occasional   Drug use: No   Sexual activity: Not on file  Other Topics Concern   Not on file  Social History Narrative   Not on file   Social Determinants of Health   Financial Resource Strain: Not on file  Food Insecurity: Not on file  Transportation Needs: Not on file  Physical Activity: Not on file  Stress: Not on file  Social Connections: Not on file  Intimate Partner Violence: Not on file    Family History  Problem Relation Age of Onset   Hypertension Other    Breast cancer Maternal Aunt     Past Surgical History:  Procedure Laterality Date   COLONOSCOPY WITH PROPOFOL N/A 08/12/2014   Procedure: COLONOSCOPY WITH PROPOFOL;  Surgeon: Garlan Fair, MD;  Location: [facility];  Service: Endoscopy;  Laterality: N/A;   ROTATOR CUFF REPAIR Bilateral right 2004, left 1999    ROS: Review of Systems Negative except as stated above  PHYSICAL EXAM: BP 120/73 (BP Location: Left Arm, Patient Position: Sitting, Cuff Size: Normal)    Pulse 61    Temp 98.5 F (36.9 C)    Resp 18    Ht 5' 5.55" (1.665 m)    Wt 183 lb (83 kg)    SpO2 98%    BMI 29.94 kg/m   Physical Exam HENT:     Head: Normocephalic and atraumatic.     Right Ear: Tympanic membrane, ear canal and external ear normal.     Left Ear: Tympanic membrane, ear canal and external ear normal.     Nose: Nose normal.     Mouth/Throat:     Mouth: Mucous membranes are moist.     Pharynx: Oropharynx is clear.  Eyes:     Extraocular Movements: Extraocular movements intact.     Conjunctiva/sclera: Conjunctivae normal.     Pupils: Pupils are equal, round, and reactive to light.  Cardiovascular:     Rate and Rhythm: Normal rate and regular rhythm.     Pulses: Normal pulses.     Heart sounds: Normal heart sounds.   Pulmonary:     Effort: Pulmonary effort is normal.     Breath sounds: Normal breath sounds.  Chest:     Comments: Patient declined exam. Abdominal:     General: Bowel sounds are normal.     Palpations: Abdomen is soft.  Genitourinary:    Comments: Patient declined exam. Musculoskeletal:        General: Normal range of motion.     Cervical back: Normal range of motion and neck supple.  Skin:    General: Skin is warm and dry.     Capillary Refill: Capillary refill  takes less than 2 seconds.     Comments: Nodule right nostril, erythematous, no evidence of drainage or blockage.  Neurological:     General: No focal deficit present.     Mental Status: She is alert and oriented to person, place, and time.  Psychiatric:        Mood and Affect: Mood normal.        Behavior: Behavior normal.   Results for orders placed or performed in visit on 11/23/21  POCT glycosylated hemoglobin (Hb A1C)  Result Value Ref Range   Hemoglobin A1C 7.4 (A) 4.0 - 5.6 %   HbA1c POC (<> result, manual entry)     HbA1c, POC (prediabetic range)     HbA1c, POC (controlled diabetic range)       ASSESSMENT AND PLAN: 1. Annual physical exam: - Counseled on 150 minutes of exercise per week as tolerated, healthy eating (including decreased daily intake of saturated fats, cholesterol, added sugars, sodium), STI prevention, and routine healthcare maintenance.  2. Screening for metabolic disorder: - Screening liver function.  - AST - ALT  3. Screening for deficiency anemia: - CBC to screen for anemia. - CBC  4. Screening cholesterol level: - Lipid panel to screen for high cholesterol.  - Lipid panel  5. Thyroid disorder screen: - TSH to check thyroid function.  - TSH  6. Need for hepatitis C screening test: - Hepatitis C antibody to screen for hepatitis C.  - Hepatitis C Antibody  7. Diabetic eye exam [facility]): - Patient declined.   8. Essential (primary) hypertension: - Continue Losartan and  Triamterene-Hydrochlorothiazide as prescribed.  - Counseled on blood pressure goal of less than 130/80, low-sodium, DASH diet, medication compliance, 150 minutes of moderate intensity exercise per week as tolerated. Discussed medication compliance, adverse effects. - Follow-up with primary provider as scheduled.  - losartan (COZAAR) 100 MG tablet; Take 1 tablet (100 mg total) by mouth daily.  Dispense: 90 tablet; Refill: 0 - triamterene-hydrochlorothiazide (MAXZIDE-25) 37.5-25 MG tablet; Take 1 tablet by mouth daily.  Dispense: 90 tablet; Refill: 0  9. Type 1 diabetes mellitus with ketoacidosis without coma ([place]): - Hemoglobin A1c today not at goal at 7.4%, goal < 7%. This is improved from previous 8.5% on 08/17/2021. - Continue Insulin Glargine and Insulin Lispro as prescribed. No refills needed as of present.  - Patient with episodes of hypoglycemia. Referral to Endocrinology for further evaluation and management.  - POCT glycosylated hemoglobin (Hb A1C) - Ambulatory referral to Endocrinology  10. Nose irritation: - Doxycycline as prescribed.  - Follow-up with primary provider as scheduled.  - doxycycline (VIBRA-TABS) 100 MG tablet; Take 1 tablet (100 mg total) by mouth 2 (two) times daily for 7 days.  Dispense: 14 tablet; Refill: 0  11. Stage 3a chronic kidney disease [facility]): - Keep all scheduled appointments with Nephrology.   12. Need for Tdap vaccination: - Administered today in office.  - Tdap vaccine greater than or equal to 7yo IM   Patient was given the opportunity to ask questions.  Patient verbalized understanding of the plan and was able to repeat key elements of the plan. Patient was given clear instructions to go to Emergency Department or return to medical center if symptoms don't improve, worsen, or new problems develop.The patient verbalized understanding.   Orders Placed This Encounter  Procedures   Tdap vaccine greater than or equal to 7yo IM   Hepatitis C Antibody    AST   ALT   CBC  Lipid panel   TSH   Ambulatory referral to Endocrinology   POCT glycosylated hemoglobin (Hb A1C)     Requested Prescriptions   Signed Prescriptions Disp Refills   doxycycline (VIBRA-TABS) 100 MG tablet 14 tablet 0    Sig: Take 1 tablet (100 mg total) by mouth 2 (two) times daily for 7 days.   losartan (COZAAR) 100 MG tablet 90 tablet 0    Sig: Take 1 tablet (100 mg total) by mouth daily.   triamterene-hydrochlorothiazide (MAXZIDE-25) 37.5-25 MG tablet 90 tablet 0    Sig: Take 1 tablet by mouth daily.    Return in about 3 months (around 02/20/2022) for Follow-Up or next available hypertension.  Camillia Herter, NP

## 2021-11-23 ENCOUNTER — Other Ambulatory Visit: Payer: Self-pay

## 2021-11-23 ENCOUNTER — Ambulatory Visit (INDEPENDENT_AMBULATORY_CARE_PROVIDER_SITE_OTHER): Payer: Self-pay | Admitting: Family

## 2021-11-23 ENCOUNTER — Encounter: Payer: Self-pay | Admitting: Family

## 2021-11-23 VITALS — BP 120/73 | HR 61 | Temp 98.5°F | Resp 18 | Ht 65.55 in | Wt 183.0 lb

## 2021-11-23 DIAGNOSIS — I10 Essential (primary) hypertension: Secondary | ICD-10-CM

## 2021-11-23 DIAGNOSIS — Z130 Encounter for screening for diseases of the blood and blood-forming organs and certain disorders involving the immune mechanism: Secondary | ICD-10-CM

## 2021-11-23 DIAGNOSIS — E119 Type 2 diabetes mellitus without complications: Secondary | ICD-10-CM

## 2021-11-23 DIAGNOSIS — Z13228 Encounter for screening for other metabolic disorders: Secondary | ICD-10-CM

## 2021-11-23 DIAGNOSIS — J3489 Other specified disorders of nose and nasal sinuses: Secondary | ICD-10-CM

## 2021-11-23 DIAGNOSIS — Z1329 Encounter for screening for other suspected endocrine disorder: Secondary | ICD-10-CM

## 2021-11-23 DIAGNOSIS — Z1159 Encounter for screening for other viral diseases: Secondary | ICD-10-CM

## 2021-11-23 DIAGNOSIS — Z13220 Encounter for screening for lipoid disorders: Secondary | ICD-10-CM

## 2021-11-23 DIAGNOSIS — Z0000 Encounter for general adult medical examination without abnormal findings: Secondary | ICD-10-CM

## 2021-11-23 DIAGNOSIS — Z23 Encounter for immunization: Secondary | ICD-10-CM

## 2021-11-23 DIAGNOSIS — E1010 Type 1 diabetes mellitus with ketoacidosis without coma: Secondary | ICD-10-CM

## 2021-11-23 DIAGNOSIS — N1831 Chronic kidney disease, stage 3a: Secondary | ICD-10-CM

## 2021-11-23 DIAGNOSIS — Z0100 Encounter for examination of eyes and vision without abnormal findings: Secondary | ICD-10-CM

## 2021-11-23 LAB — POCT GLYCOSYLATED HEMOGLOBIN (HGB A1C): Hemoglobin A1C: 7.4 % — AB (ref 4.0–5.6)

## 2021-11-23 MED ORDER — TRIAMTERENE-HCTZ 37.5-25 MG PO TABS
1.0000 | ORAL_TABLET | Freq: Every day | ORAL | 0 refills | Status: DC
  Filled 2021-11-23: qty 30, 30d supply, fill #0

## 2021-11-23 MED ORDER — DOXYCYCLINE HYCLATE 100 MG PO TABS
100.0000 mg | ORAL_TABLET | Freq: Two times a day (BID) | ORAL | 0 refills | Status: AC
Start: 2021-11-23 — End: 2021-11-30
  Filled 2021-11-23: qty 14, 7d supply, fill #0

## 2021-11-23 MED ORDER — LOSARTAN POTASSIUM 100 MG PO TABS
100.0000 mg | ORAL_TABLET | Freq: Every day | ORAL | 0 refills | Status: DC
  Filled 2021-11-23: qty 90, 90d supply, fill #0
  Filled 2022-01-03: qty 30, 30d supply, fill #0

## 2021-11-23 NOTE — Progress Notes (Signed)
Diabetes discussed in office.

## 2021-11-23 NOTE — Progress Notes (Signed)
Pt presents for annual physical exam, pt has not seen endocrinology since 04/2021 Dr. Buddy Duty due to now unemployed..07/28/2021 -8.5% down to 7.4% Pt having nasal ulcerations in right side of nostril states she was prescribed doxycycline when it first happened  Tdap administered left deltoid pt tolerated injection well

## 2021-11-23 NOTE — Patient Instructions (Signed)

## 2021-11-24 ENCOUNTER — Other Ambulatory Visit: Payer: Self-pay

## 2021-11-24 ENCOUNTER — Other Ambulatory Visit: Payer: Self-pay | Admitting: Family

## 2021-11-24 DIAGNOSIS — E785 Hyperlipidemia, unspecified: Secondary | ICD-10-CM

## 2021-11-24 LAB — LIPID PANEL
Chol/HDL Ratio: 2.6 ratio (ref 0.0–4.4)
Cholesterol, Total: 164 mg/dL (ref 100–199)
HDL: 63 mg/dL (ref >39)
LDL Chol Calc (NIH): 86 mg/dL (ref 0–99)
Triglycerides: 79 mg/dL (ref 0–149)
VLDL Cholesterol Cal: 15 mg/dL (ref 5–40)

## 2021-11-24 LAB — AST: AST: 12 IU/L (ref 0–40)

## 2021-11-24 LAB — ALT: ALT: 8 IU/L (ref 0–32)

## 2021-11-24 LAB — CBC
Hematocrit: 44.8 % (ref 34.0–46.6)
Hemoglobin: 14.3 g/dL (ref 11.1–15.9)
MCH: 27.8 pg (ref 26.6–33.0)
MCHC: 31.9 g/dL (ref 31.5–35.7)
MCV: 87 fL (ref 79–97)
Platelets: 228 10*3/uL (ref 150–450)
RBC: 5.15 x10E6/uL (ref 3.77–5.28)
RDW: 13.1 % (ref 11.7–15.4)
WBC: 6.5 10*3/uL (ref 3.4–10.8)

## 2021-11-24 LAB — TSH: TSH: 3.64 u[IU]/mL (ref 0.450–4.500)

## 2021-11-24 LAB — HEPATITIS C ANTIBODY: Hep C Virus Ab: <0.1 s/co ratio (ref 0.0–0.9)

## 2021-11-24 MED ORDER — ATORVASTATIN CALCIUM 40 MG PO TABS
40.0000 mg | ORAL_TABLET | ORAL | 0 refills | Status: DC
  Filled 2021-11-24: qty 120, 120d supply, fill #0
  Filled 2022-01-03: qty 30, 30d supply, fill #0
  Filled 2022-04-11: qty 90, 90d supply, fill #1

## 2021-11-24 NOTE — Progress Notes (Signed)
Liver function normal.   Thyroid function normal.   No anemia.   Hepatitis C negative.   Continue Atorvastatin for cholesterol management.

## 2021-12-02 ENCOUNTER — Other Ambulatory Visit: Payer: Self-pay | Admitting: Nephrology

## 2021-12-02 ENCOUNTER — Other Ambulatory Visit: Payer: Self-pay

## 2021-12-02 ENCOUNTER — Other Ambulatory Visit (HOSPITAL_COMMUNITY): Payer: Self-pay

## 2021-12-02 DIAGNOSIS — N1831 Chronic kidney disease, stage 3a: Secondary | ICD-10-CM

## 2021-12-02 LAB — MICROALBUMIN / CREATININE URINE RATIO: Microalb Creat Ratio: 4

## 2021-12-02 LAB — COMPREHENSIVE METABOLIC PANEL: eGFR: 67

## 2021-12-08 ENCOUNTER — Other Ambulatory Visit: Payer: Self-pay

## 2021-12-08 ENCOUNTER — Other Ambulatory Visit: Payer: Self-pay | Admitting: Family

## 2021-12-08 DIAGNOSIS — F32A Depression, unspecified: Secondary | ICD-10-CM

## 2021-12-08 MED ORDER — SERTRALINE HCL 100 MG PO TABS
100.0000 mg | ORAL_TABLET | Freq: Every day | ORAL | 0 refills | Status: DC
  Filled 2021-12-08: qty 90, 90d supply, fill #0

## 2021-12-13 ENCOUNTER — Other Ambulatory Visit: Payer: Self-pay

## 2022-01-03 ENCOUNTER — Other Ambulatory Visit: Payer: Self-pay | Admitting: Family

## 2022-01-03 ENCOUNTER — Other Ambulatory Visit (HOSPITAL_COMMUNITY): Payer: Self-pay

## 2022-01-03 ENCOUNTER — Other Ambulatory Visit: Payer: Self-pay

## 2022-01-03 DIAGNOSIS — K219 Gastro-esophageal reflux disease without esophagitis: Secondary | ICD-10-CM

## 2022-01-03 DIAGNOSIS — E1010 Type 1 diabetes mellitus with ketoacidosis without coma: Secondary | ICD-10-CM

## 2022-01-03 MED ORDER — OMEPRAZOLE 40 MG PO CPDR
40.0000 mg | DELAYED_RELEASE_CAPSULE | Freq: Every day | ORAL | 0 refills | Status: DC
  Filled 2022-01-03: qty 30, 30d supply, fill #0
  Filled 2022-04-11: qty 30, 30d supply, fill #1
  Filled 2022-06-03: qty 30, 30d supply, fill #2

## 2022-01-03 MED ORDER — INSULIN LISPRO 100 UNIT/ML IJ SOLN
8.0000 [IU] | Freq: Three times a day (TID) | INTRAMUSCULAR | 0 refills | Status: DC
  Filled 2022-01-03: qty 20, 84d supply, fill #0
  Filled 2022-01-06: qty 10, 42d supply, fill #0
  Filled 2022-02-08: qty 10, 28d supply, fill #1
  Filled 2022-02-09: qty 10, 42d supply, fill #1

## 2022-01-06 ENCOUNTER — Other Ambulatory Visit: Payer: Self-pay | Admitting: Family

## 2022-01-06 ENCOUNTER — Other Ambulatory Visit: Payer: Self-pay | Admitting: Pharmacist

## 2022-01-06 ENCOUNTER — Other Ambulatory Visit: Payer: Self-pay | Admitting: Nurse Practitioner

## 2022-01-06 ENCOUNTER — Other Ambulatory Visit: Payer: Self-pay

## 2022-01-06 MED ORDER — INSULIN GLARGINE 100 UNIT/ML ~~LOC~~ SOLN
30.0000 [IU] | Freq: Every day | SUBCUTANEOUS | 0 refills | Status: DC
Start: 2022-01-06 — End: 2022-01-06
  Filled 2022-01-06: qty 10, 33d supply, fill #0

## 2022-01-06 MED ORDER — BASAGLAR KWIKPEN 100 UNIT/ML ~~LOC~~ SOPN
30.0000 [IU] | PEN_INJECTOR | Freq: Every day | SUBCUTANEOUS | 2 refills | Status: DC
  Filled 2022-01-06: qty 9, 30d supply, fill #0
  Filled 2022-02-08: qty 9, 30d supply, fill #1
  Filled 2022-03-10: qty 9, 30d supply, fill #2

## 2022-01-06 NOTE — Telephone Encounter (Signed)
Pt called saying she needs a refill on her [place].  She said it was not ask for when the prescriptions were sent in.   ?[facility] pharmacy told her to call the office ?

## 2022-02-09 ENCOUNTER — Other Ambulatory Visit: Payer: Self-pay

## 2022-02-09 ENCOUNTER — Other Ambulatory Visit (HOSPITAL_COMMUNITY): Payer: Self-pay

## 2022-02-09 NOTE — Progress Notes (Signed)
? ? ?Patient ID: Carrie Mcpherson, female    DOB: [birth year]  MRN: [id number] ? ?CC: Hypertension Follow-Up  ? ?Subjective: ?Carrie Mcpherson is a 61 y.o. female who presents for hypertension follow-up.  ? ?Her concerns today include:  ?Hypertension follow-up: ?11/23/2021: ?- Continue Losartan and Triamterene-Hydrochlorothiazide as prescribed.  ? ?02/15/2022: ?Doing well on current regimen. No side effects. No issues/concerns. Denies chest pain, shortness of breath, worst headache of life and additional red flag symptoms. Home blood pressures about the same as today in office. ? ?2. Diabetes type 1 follow-up: ?Reports has been unable to get appointment with Endocrinology due to being busy. Plans to schedule soon. ? ?Patient Active Problem List  ? Diagnosis Date Noted  ? CKD (chronic kidney disease) 07/20/2021  ? DKA, type 1 ([place]) 07/15/2021  ? Type 1 diabetes mellitus with ketoacidosis without coma ([place]) 10/26/2019  ? DKA (diabetic ketoacidoses) 10/07/2015  ? Essential hypertension 10/07/2015  ? GERD (gastroesophageal reflux disease) 10/07/2015  ? Depression 10/07/2015  ? AKI (acute kidney injury) ([place]) 10/07/2015  ? Acute pharyngitis 10/07/2015  ? Tobacco abuse 10/07/2015  ?  ? ?Current Outpatient Medications on File Prior to Visit  ?Medication Sig Dispense Refill  ? aspirin EC 81 MG tablet Take 81 mg by mouth daily.    ? atorvastatin (LIPITOR) 40 MG tablet Take 1 tablet (40 mg total) by mouth every morning. 120 tablet 0  ? Insulin Glargine (BASAGLAR KWIKPEN) 100 UNIT/ML Inject 30 Units into the skin daily. 9 mL 2  ? insulin lispro (HUMALOG) 100 UNIT/ML injection Inject 0.08 mLs (8 Units total) into the skin 3 (three) times daily with meals. 20 mL 0  ? Insulin Syringe-Needle U-100 31G X 5/16" 0.5 ML MISC USE TO INJECT Lantus 20 units daily and Humalog 8 units 3 TIMES DAILY with meals 100 each 1  ? Multiple Vitamins-Minerals (MULTIVITAMIN WITH MINERALS) tablet Take 1 tablet by mouth 3 (three) times a week.     ? omeprazole  (PRILOSEC) 40 MG capsule Take 1 capsule (40 mg total) by mouth daily. 90 capsule 0  ? ondansetron (ZOFRAN) 4 MG tablet Take 1 tablet (4 mg total) by mouth every 6 (six) hours as needed for nausea. 20 tablet 0  ? sertraline (ZOLOFT) 100 MG tablet Take 1 tablet (100 mg total) by mouth daily for 90 doses. 90 tablet 0  ? vitamin B-12 (CYANOCOBALAMIN) 500 MCG tablet Take 500 mcg by mouth daily.    ? ?No current facility-administered medications on file prior to visit.  ? ? ?Allergies  ?Allergen Reactions  ? Bactrim [Sulfamethoxazole-Trimethoprim] Itching  ? ? ?Social History  ? ?Socioeconomic History  ? Marital status: Single  ?  Spouse name: Not on file  ? Number of children: Not on file  ? Years of education: Not on file  ? Highest education level: Not on file  ?Occupational History  ? Not on file  ?Tobacco Use  ? Smoking status: Every Day  ?  Packs/day: 0.25  ?  Years: 30.00  ?  Pack years: 7.50  ?  Types: Cigarettes  ?  Passive exposure: Current  ? Smokeless tobacco: Never  ?Vaping Use  ? Vaping Use: Never used  ?Substance and Sexual Activity  ? Alcohol use: Yes  ?  Comment: occasional  ? Drug use: No  ? Sexual activity: Not on file  ?Other Topics Concern  ? Not on file  ?Social History Narrative  ? Not on file  ? ?Social Determinants of Health  ? ?  Financial Resource Strain: Not on file  ?Food Insecurity: Not on file  ?Transportation Needs: Not on file  ?Physical Activity: Not on file  ?Stress: Not on file  ?Social Connections: Not on file  ?Intimate Partner Violence: Not on file  ? ? ?Family History  ?Problem Relation Age of Onset  ? Hypertension Other   ? Breast cancer Maternal Aunt   ? ? ?Past Surgical History:  ?Procedure Laterality Date  ? COLONOSCOPY WITH PROPOFOL N/A 08/12/2014  ? Procedure: COLONOSCOPY WITH PROPOFOL;  Surgeon: Garlan Fair, MD;  Location: [facility];  Service: Endoscopy;  Laterality: N/A;  ? ROTATOR CUFF REPAIR Bilateral right 2004, left 1999  ? ? ?ROS: ?Review of Systems ?Negative  except as stated above ? ?PHYSICAL EXAM: ?BP 129/82 (BP Location: Left Arm, Patient Position: Sitting, Cuff Size: Large)   Pulse 69   Temp 98.3 ?F (36.8 ?C)   Resp 18   Ht 5' 5.55" (1.665 m)   Wt 177 lb (80.3 kg)   SpO2 98%   BMI 28.96 kg/m?  ? ?Physical Exam ?HENT:  ?   Head: Normocephalic and atraumatic.  ?Eyes:  ?   Extraocular Movements: Extraocular movements intact.  ?   Conjunctiva/sclera: Conjunctivae normal.  ?   Pupils: Pupils are equal, round, and reactive to light.  ?Cardiovascular:  ?   Rate and Rhythm: Normal rate and regular rhythm.  ?   Pulses: Normal pulses.  ?   Heart sounds: Normal heart sounds.  ?Pulmonary:  ?   Effort: Pulmonary effort is normal.  ?   Breath sounds: Normal breath sounds.  ?Musculoskeletal:  ?   Cervical back: Normal range of motion and neck supple.  ?Neurological:  ?   General: No focal deficit present.  ?   Mental Status: She is alert and oriented to person, place, and time.  ?Psychiatric:     ?   Mood and Affect: Mood normal.     ?   Behavior: Behavior normal.  ? ? ?ASSESSMENT AND PLAN: ?1. Essential (primary) hypertension: ?- Continue Losartan and Triamterene-Hydrochlorothiazide as prescribed.  ?- Counseled on blood pressure goal of less than 130/80, low-sodium, DASH diet, medication compliance, and 150 minutes of moderate intensity exercise per week as tolerated. Counseled on medication adherence and adverse effects. ?- Follow-up with primary provider in 4 months or sooner if needed.  ?- losartan (COZAAR) 100 MG tablet; Take 1 tablet (100 mg total) by mouth daily.  Dispense: 120 tablet; Refill: 0 ?- triamterene-hydrochlorothiazide (MAXZIDE-25) 37.5-25 MG tablet; Take 1 tablet by mouth daily.  Dispense: 120 tablet; Refill: 0 ? ?2. Type 1 diabetes mellitus with ketoacidosis without coma [facility]): ?- Referral to Endocrinology for further evaluation and management.  ?- Ambulatory referral to Endocrinology ? ? ? ?Patient was given the opportunity to ask questions.  Patient  verbalized understanding of the plan and was able to repeat key elements of the plan. Patient was given clear instructions to go to Emergency Department or return to medical center if symptoms don't improve, worsen, or new problems develop.The patient verbalized understanding. ? ? ?Orders Placed This Encounter  ?Procedures  ? Ambulatory referral to Endocrinology  ? ? ? ?Requested Prescriptions  ? ?Signed Prescriptions Disp Refills  ? losartan (COZAAR) 100 MG tablet 120 tablet 0  ?  Sig: Take 1 tablet (100 mg total) by mouth daily.  ? triamterene-hydrochlorothiazide (MAXZIDE-25) 37.5-25 MG tablet 120 tablet 0  ?  Sig: Take 1 tablet by mouth daily.  ? ? ?Return in about  4 months (around 06/17/2022) for Follow-Up or next available HTN. ? ?Camillia Herter, NP  ?

## 2022-02-11 ENCOUNTER — Other Ambulatory Visit: Payer: Self-pay

## 2022-02-11 ENCOUNTER — Telehealth: Payer: Self-pay | Admitting: Family

## 2022-02-11 NOTE — Telephone Encounter (Signed)
Pt called in for assistance. Pt says that she need to renew her pharmacy card and need the forms and further assistance.  ? ? ?CB: [phone]- please assist further.  ?

## 2022-02-15 ENCOUNTER — Encounter: Payer: Self-pay | Admitting: Family

## 2022-02-15 ENCOUNTER — Ambulatory Visit (INDEPENDENT_AMBULATORY_CARE_PROVIDER_SITE_OTHER): Payer: Self-pay | Admitting: Family

## 2022-02-15 ENCOUNTER — Other Ambulatory Visit: Payer: Self-pay

## 2022-02-15 VITALS — BP 129/82 | HR 69 | Temp 98.3°F | Resp 18 | Ht 65.55 in | Wt 177.0 lb

## 2022-02-15 DIAGNOSIS — I10 Essential (primary) hypertension: Secondary | ICD-10-CM

## 2022-02-15 DIAGNOSIS — E1010 Type 1 diabetes mellitus with ketoacidosis without coma: Secondary | ICD-10-CM

## 2022-02-15 MED ORDER — LOSARTAN POTASSIUM 100 MG PO TABS
100.0000 mg | ORAL_TABLET | Freq: Every day | ORAL | 0 refills | Status: DC
  Filled 2022-02-15: qty 30, 30d supply, fill #0
  Filled 2022-04-11: qty 90, 90d supply, fill #1

## 2022-02-15 MED ORDER — TRIAMTERENE-HCTZ 37.5-25 MG PO TABS
1.0000 | ORAL_TABLET | Freq: Every day | ORAL | 0 refills | Status: DC
  Filled 2022-02-15: qty 30, 30d supply, fill #0
  Filled 2022-04-11: qty 90, 90d supply, fill #1

## 2022-02-15 NOTE — Progress Notes (Signed)
.  Pt presents for hypertension f/u   

## 2022-02-16 ENCOUNTER — Other Ambulatory Visit: Payer: Self-pay

## 2022-02-28 ENCOUNTER — Other Ambulatory Visit: Payer: Self-pay

## 2022-03-10 ENCOUNTER — Other Ambulatory Visit: Payer: Self-pay | Admitting: Family

## 2022-03-10 ENCOUNTER — Other Ambulatory Visit: Payer: Self-pay

## 2022-03-10 DIAGNOSIS — E1010 Type 1 diabetes mellitus with ketoacidosis without coma: Secondary | ICD-10-CM

## 2022-03-10 MED ORDER — INSULIN LISPRO 100 UNIT/ML IJ SOLN
8.0000 [IU] | Freq: Three times a day (TID) | INTRAMUSCULAR | 0 refills | Status: DC
  Filled 2022-03-10: qty 20, 84d supply, fill #0

## 2022-03-10 NOTE — Telephone Encounter (Signed)
Requested Prescriptions  Pending Prescriptions Disp Refills  . insulin lispro (HUMALOG) 100 UNIT/ML injection 20 mL 0    Sig: Inject 0.08 mLs (8 Units total) into the skin 3 (three) times daily with meals.     Endocrinology:  Diabetes - Insulins Passed - 03/10/2022  1:46 PM      Passed - HBA1C is between 0 and 7.9 and within 180 days    Hemoglobin A1C  Date Value Ref Range Status  11/23/2021 7.4 (A) 4.0 - 5.6 % Final   Hgb A1c MFr Bld  Date Value Ref Range Status  07/15/2021 8.6 (H) 4.8 - 5.6 % Final    Comment:    (NOTE)         Prediabetes: 5.7 - 6.4         Diabetes: >6.4         Glycemic control for adults with diabetes: <7.0          Passed - Valid encounter within last 6 months    Recent Outpatient Visits          3 weeks ago Essential (primary) hypertension   Primary Care at [facility], Amy J, NP   3 months ago Annual physical exam   Primary Care at [facility], Amy J, NP   6 months ago Type 1 diabetes mellitus with ketoacidosis without coma [facility])   Primary Care at [facility], Amy J, NP   7 months ago Encounter to establish care   Primary Care at [facility], Flonnie Hailstone, NP      Future Appointments            In 3 months Camillia Herter, NP Primary Care at [facility]

## 2022-03-15 ENCOUNTER — Other Ambulatory Visit: Payer: Self-pay

## 2022-04-11 ENCOUNTER — Other Ambulatory Visit: Payer: Self-pay

## 2022-04-11 ENCOUNTER — Other Ambulatory Visit: Payer: Self-pay | Admitting: Family

## 2022-04-11 ENCOUNTER — Other Ambulatory Visit: Payer: Self-pay | Admitting: Family Medicine

## 2022-04-11 DIAGNOSIS — E1065 Type 1 diabetes mellitus with hyperglycemia: Secondary | ICD-10-CM

## 2022-04-11 DIAGNOSIS — E1010 Type 1 diabetes mellitus with ketoacidosis without coma: Secondary | ICD-10-CM

## 2022-04-11 DIAGNOSIS — F419 Anxiety disorder, unspecified: Secondary | ICD-10-CM

## 2022-04-11 MED ORDER — INSULIN LISPRO 100 UNIT/ML IJ SOLN
8.0000 [IU] | Freq: Three times a day (TID) | INTRAMUSCULAR | 0 refills | Status: DC
  Filled 2022-04-11: qty 20, 84d supply, fill #0
  Filled 2022-06-03: qty 10, 28d supply, fill #0
  Filled 2022-07-12: qty 10, 28d supply, fill #1

## 2022-04-11 MED ORDER — BASAGLAR KWIKPEN 100 UNIT/ML ~~LOC~~ SOPN
30.0000 [IU] | PEN_INJECTOR | Freq: Every day | SUBCUTANEOUS | 2 refills | Status: DC
  Filled 2022-04-11: qty 9, 30d supply, fill #0
  Filled 2022-05-12: qty 9, 30d supply, fill #1
  Filled 2022-06-03: qty 9, 30d supply, fill #2

## 2022-04-11 MED ORDER — SERTRALINE HCL 100 MG PO TABS
100.0000 mg | ORAL_TABLET | Freq: Every day | ORAL | 2 refills | Status: DC
Start: 2022-04-11 — End: 2022-08-04
  Filled 2022-04-11: qty 30, 30d supply, fill #0
  Filled 2022-06-03: qty 30, 30d supply, fill #1
  Filled 2022-07-04: qty 30, 30d supply, fill #2

## 2022-04-11 NOTE — Telephone Encounter (Signed)
Refilled per patient request. 

## 2022-04-12 ENCOUNTER — Other Ambulatory Visit: Payer: Self-pay

## 2022-05-12 ENCOUNTER — Other Ambulatory Visit: Payer: Self-pay

## 2022-06-03 ENCOUNTER — Other Ambulatory Visit: Payer: Self-pay

## 2022-06-06 NOTE — Progress Notes (Signed)
Patient ID: Carrie Mcpherson, female    DOB: October 05, 1961  MRN: [id number]  CC: Chronic Care Management   Subjective: Carrie Mcpherson is a 61 y.o. female who presents for chronic care management.   Her concerns today include:  Doing well on blood pressure medications without issues or concerns. Has not returned call to Endocrinology as of yet but plans to do so soon.   Patient Active Problem List   Diagnosis Date Noted   CKD (chronic kidney disease) 07/20/2021   DKA, type 1 ([place]) 07/15/2021   Type 1 diabetes mellitus with ketoacidosis without coma ([place]) 10/26/2019   DKA (diabetic ketoacidoses) 10/07/2015   Essential hypertension 10/07/2015   GERD (gastroesophageal reflux disease) 10/07/2015   Depression 10/07/2015   AKI (acute kidney injury) ([place]) 10/07/2015   Acute pharyngitis 10/07/2015   Tobacco abuse 10/07/2015     Current Outpatient Medications on File Prior to Visit  Medication Sig Dispense Refill   aspirin EC 81 MG tablet Take 81 mg by mouth daily.     atorvastatin (LIPITOR) 40 MG tablet Take 1 tablet (40 mg total) by mouth every morning. 120 tablet 0   doxycycline (VIBRAMYCIN) 100 MG capsule Take 1 capsule (100 mg total) by mouth 2 (two) times daily. 20 capsule 0   Insulin Glargine (BASAGLAR KWIKPEN) 100 UNIT/ML Inject 30 Units into the skin daily. 9 mL 2   insulin lispro (HUMALOG) 100 UNIT/ML injection Inject 0.08 mLs (8 Units total) into the skin 3 (three) times daily with meals. 20 mL 0   Insulin Syringe-Needle U-100 31G X 5/16" 0.5 ML MISC USE TO INJECT Lantus 20 units daily and Humalog 8 units 3 TIMES DAILY with meals 100 each 1   Multiple Vitamins-Minerals (MULTIVITAMIN WITH MINERALS) tablet Take 1 tablet by mouth 3 (three) times a week.      omeprazole (PRILOSEC) 40 MG capsule Take 1 capsule (40 mg total) by mouth daily. 90 capsule 0   ondansetron (ZOFRAN) 4 MG tablet Take 1 tablet (4 mg total) by mouth every 6 (six) hours as needed for nausea. 20 tablet 0   sertraline  (ZOLOFT) 100 MG tablet Take 1 tablet (100 mg total) by mouth daily for 90 doses. 30 tablet 2   vitamin B-12 (CYANOCOBALAMIN) 500 MCG tablet Take 500 mcg by mouth daily.     No current facility-administered medications on file prior to visit.    Allergies  Allergen Reactions   Bactrim [Sulfamethoxazole-Trimethoprim] Itching    Social History   Socioeconomic History   Marital status: Single    Spouse name: Not on file   Number of children: Not on file   Years of education: Not on file   Highest education level: Not on file  Occupational History   Not on file  Tobacco Use   Smoking status: Every Day    Packs/day: 0.25    Years: 30.00    Total pack years: 7.50    Types: Cigarettes    Passive exposure: Current   Smokeless tobacco: Never  Vaping Use   Vaping Use: Never used  Substance and Sexual Activity   Alcohol use: Yes    Comment: occasional   Drug use: No   Sexual activity: Not on file  Other Topics Concern   Not on file  Social History Narrative   Not on file   Social Determinants of Health   Financial Resource Strain: Not on file  Food Insecurity: Not on file  Transportation Needs: Not on file  Physical  Activity: Not on file  Stress: Not on file  Social Connections: Not on file  Intimate Partner Violence: Not on file    Family History  Problem Relation Age of Onset   Hypertension Other    Breast cancer Maternal Aunt     Past Surgical History:  Procedure Laterality Date   COLONOSCOPY WITH PROPOFOL N/A 08/12/2014   Procedure: COLONOSCOPY WITH PROPOFOL;  Surgeon: Garlan Fair, MD;  Location: [facility];  Service: Endoscopy;  Laterality: N/A;   ROTATOR CUFF REPAIR Bilateral right 2004, left 1999    ROS: Review of Systems Negative except as stated above  PHYSICAL EXAM: BP 127/80 (BP Location: Left Arm, Patient Position: Sitting, Cuff Size: Normal)   Pulse 61   Temp 98.3 F (36.8 C)   Resp 16   Wt 170 lb (77.1 kg)   SpO2 98%   BMI 27.82  kg/m    Physical Exam HENT:     Head: Normocephalic and atraumatic.  Eyes:     Extraocular Movements: Extraocular movements intact.     Conjunctiva/sclera: Conjunctivae normal.     Pupils: Pupils are equal, round, and reactive to light.  Cardiovascular:     Rate and Rhythm: Normal rate and regular rhythm.     Pulses: Normal pulses.     Heart sounds: Normal heart sounds.  Pulmonary:     Effort: Pulmonary effort is normal.     Breath sounds: Normal breath sounds.  Musculoskeletal:     Cervical back: Normal range of motion and neck supple.  Neurological:     General: No focal deficit present.     Mental Status: She is alert and oriented to person, place, and time.  Psychiatric:        Mood and Affect: Mood normal.        Behavior: Behavior normal.     ASSESSMENT AND PLAN: 1. Essential (primary) hypertension - Continue Losartan and Triamterene-Hydrochlorothiazide as prescribed.  - Counseled on blood pressure goal of less than 130/80, low-sodium, DASH diet, medication compliance, and 150 minutes of moderate intensity exercise per week as tolerated. Counseled on medication adherence and adverse effects. - Update CMP. - Follow-up with primary provider in 4 months or sooner if needed.  - CMP14+EGFR - losartan (COZAAR) 100 MG tablet; Take 1 tablet (100 mg total) by mouth daily.  Dispense: 30 tablet; Refill: 3 - triamterene-hydrochlorothiazide (MAXZIDE-25) 37.5-25 MG tablet; Take 1 tablet by mouth daily.  Dispense: 30 tablet; Refill: 3  2. Type 1 diabetes mellitus with ketoacidosis without coma [facility]) - Patient provided with contact information to [facility] and states she plans to call them soon.    Patient was given the opportunity to ask questions.  Patient verbalized understanding of the plan and was able to repeat key elements of the plan. Patient was given clear instructions to go to Emergency Department or return to medical center if symptoms don't  improve, worsen, or new problems develop.The patient verbalized understanding.   Orders Placed This Encounter  Procedures   CMP14+EGFR     Requested Prescriptions   Signed Prescriptions Disp Refills   losartan (COZAAR) 100 MG tablet 30 tablet 3    Sig: Take 1 tablet (100 mg total) by mouth daily.   triamterene-hydrochlorothiazide (MAXZIDE-25) 37.5-25 MG tablet 30 tablet 3    Sig: Take 1 tablet by mouth daily.    Return in about 4 months (around 10/17/2022) for Follow-Up or next available chronic care mgmt.  Camillia Herter, NP

## 2022-06-10 ENCOUNTER — Ambulatory Visit
Admission: EM | Admit: 2022-06-10 | Discharge: 2022-06-10 | Disposition: A | Discharge status: Home / Self Care | Payer: Self-pay | Attending: Physician Assistant | Admitting: Physician Assistant

## 2022-06-10 DIAGNOSIS — E1010 Type 1 diabetes mellitus with ketoacidosis without coma: Secondary | ICD-10-CM

## 2022-06-10 DIAGNOSIS — T148XXA Other injury of unspecified body region, initial encounter: Secondary | ICD-10-CM

## 2022-06-10 MED ORDER — DOXYCYCLINE HYCLATE 100 MG PO CAPS: 100.0000 mg | ORAL_CAPSULE | Freq: Two times a day (BID) | ORAL | 0 refills | Status: DC

## 2022-06-10 NOTE — ED Provider Notes (Signed)
Carrie Mcpherson    CSN: [id number] Arrival date & time: 06/10/22  1847      History   Chief Complaint Chief Complaint  Patient presents with   Abrasion    HPI Carrie Mcpherson is a 61 y.o. female.   Patient here today for evaluation of abrasion to her left arm that she has sustained a few days ago.  She states that she was loading topsoil into her outdoor wagon when she scraped her arm.  She notes that the next day she was going to do yard work and had area covered but was too tight and had to unbandaged.  Since that time she has noticed some areas of possible purulent drainage underneath scab.  She is a type I diabetic and was concerned for same.  She is up-to-date on tetanus vaccine.  The history is provided by the patient.    Past Medical History:  Diagnosis Date   Blood clot in vein 1994    superficial after right ankle fracture   Depression    Diabetes mellitus without complication (HCC)    GERD (gastroesophageal reflux disease)    occasional   Hypertension    PONV (postoperative nausea and vomiting)    vomiting after one surgery x 1   Sciatic leg pain    right leg   Sleep apnea sept 2014   borderline, no cpap needed    Patient Active Problem List   Diagnosis Date Noted   CKD (chronic kidney disease) 07/20/2021   DKA, type 1 ([place]) 07/15/2021   Type 1 diabetes mellitus with ketoacidosis without coma ([place]) 10/26/2019   DKA (diabetic ketoacidoses) 10/07/2015   Essential hypertension 10/07/2015   GERD (gastroesophageal reflux disease) 10/07/2015   Depression 10/07/2015   AKI (acute kidney injury) ([place]) 10/07/2015   Acute pharyngitis 10/07/2015   Tobacco abuse 10/07/2015    Past Surgical History:  Procedure Laterality Date   COLONOSCOPY WITH PROPOFOL N/A 08/12/2014   Procedure: COLONOSCOPY WITH PROPOFOL;  Surgeon: Garlan Fair, MD;  Location: [facility];  Service: Endoscopy;  Laterality: N/A;   ROTATOR CUFF REPAIR Bilateral right 2004, left 1999     OB History   No obstetric history on file.      Home Medications    Prior to Admission medications   Medication Sig Start Date End Date Taking? Authorizing Provider  doxycycline (VIBRAMYCIN) 100 MG capsule Take 1 capsule (100 mg total) by mouth 2 (two) times daily. 06/10/22  Yes Francene Finders, PA-C  aspirin EC 81 MG tablet Take 81 mg by mouth daily.    [provider]  atorvastatin (LIPITOR) 40 MG tablet Take 1 tablet (40 mg total) by mouth every morning. 11/24/21 07/11/22  Camillia Herter, NP  Insulin Glargine [facility] KWIKPEN) 100 UNIT/ML Inject 30 Units into the skin daily. 04/11/22   Camillia Herter, NP  insulin lispro (HUMALOG) 100 UNIT/ML injection Inject 0.08 mLs (8 Units total) into the skin 3 (three) times daily with meals. 04/11/22   Camillia Herter, NP  Insulin Syringe-Needle U-100 31G X 5/16" 0.5 ML MISC USE TO INJECT Lantus 20 units daily and Humalog 8 units 3 TIMES DAILY with meals 08/17/21   Camillia Herter, NP  losartan (COZAAR) 100 MG tablet Take 1 tablet (100 mg total) by mouth daily. 02/15/22 07/11/22  Camillia Herter, NP  Multiple Vitamins-Minerals (MULTIVITAMIN WITH MINERALS) tablet Take 1 tablet by mouth 3 (three) times a week.     [provider]  omeprazole (PRILOSEC) 40 MG capsule Take 1 capsule (40 mg total) by mouth daily. 01/03/22 07/03/22  Camillia Herter, NP  ondansetron (ZOFRAN) 4 MG tablet Take 1 tablet (4 mg total) by mouth every 6 (six) hours as needed for nausea. 07/16/21   Aline August, MD  sertraline (ZOLOFT) 100 MG tablet Take 1 tablet (100 mg total) by mouth daily for 90 doses. 04/11/22 07/10/22  Camillia Herter, NP  triamterene-hydrochlorothiazide (MAXZIDE-25) 37.5-25 MG tablet Take 1 tablet by mouth daily. 02/15/22 07/11/22  Camillia Herter, NP  vitamin B-12 (CYANOCOBALAMIN) 500 MCG tablet Take 500 mcg by mouth daily.    [provider]    Family History Family History  Problem Relation Age of Onset   Hypertension Other     Breast cancer Maternal Aunt     Social History Social History   Tobacco Use   Smoking status: Every Day    Packs/day: 0.25    Years: 30.00    Total pack years: 7.50    Types: Cigarettes    Passive exposure: Current   Smokeless tobacco: Never  Vaping Use   Vaping Use: Never used  Substance Use Topics   Alcohol use: Yes    Comment: occasional   Drug use: No     Allergies   Bactrim [sulfamethoxazole-trimethoprim]   Review of Systems Review of Systems  Constitutional:  Negative for chills and fever.  Eyes:  Negative for discharge and redness.  Gastrointestinal:  Negative for nausea and vomiting.  Skin:  Positive for wound.     Physical Exam Triage Vital Signs ED Triage Vitals  Enc Vitals Group     BP 06/10/22 1857 (!) 153/87     Pulse Rate 06/10/22 1857 77     Resp 06/10/22 1857 16     Temp 06/10/22 1857 97.9 F (36.6 C)     Temp Source 06/10/22 1857 Oral     SpO2 06/10/22 1857 98 %     Weight --      Height --      Head Circumference --      Peak Flow --      Pain Score 06/10/22 1858 3     Pain Loc --      Pain Edu? --      Excl. in [place]? --    No data found.  Updated Vital Signs BP (!) 153/87 (BP Location: Left Arm)   Pulse 77   Temp 97.9 F (36.6 C) (Oral)   Resp 16   SpO2 98%   Physical Exam Vitals and nursing note reviewed.  Constitutional:      General: She is not in acute distress.    Appearance: Normal appearance. She is not ill-appearing.  HENT:     Head: Normocephalic and atraumatic.  Eyes:     Conjunctiva/sclera: Conjunctivae normal.  Cardiovascular:     Rate and Rhythm: Normal rate.  Pulmonary:     Effort: Pulmonary effort is normal.  Skin:    Comments: Few linear abrasions to left forearm with minimal surrounding erythema, no active bleeding or drainage.  Neurological:     Mental Status: She is alert.  Psychiatric:        Mood and Affect: Mood normal.        Behavior: Behavior normal.        Thought Content: Thought content  normal.      UC Treatments / Results  Labs (all labs ordered are listed, but only abnormal results are displayed) Labs  Reviewed - No data to display  EKG   Radiology No results found.  Procedures Procedures (including critical Mcpherson time)  Medications Ordered in UC Medications - No data to display  Initial Impression / Assessment and Plan / UC Course  I have reviewed the triage vital signs and the nursing notes.  Pertinent labs & imaging results that were available during my Mcpherson of the patient were reviewed by me and considered in my medical decision making (see chart for details).    We will treat to cover possible impending infection with doxycycline given diabetic status.  Recommended follow-up if no gradual improvement or with any further concerns.  Final Clinical Impressions(s) / UC Diagnoses   Final diagnoses:  Abrasion   Discharge Instructions   None    ED Prescriptions     Medication Sig Dispense Auth. Provider   doxycycline (VIBRAMYCIN) 100 MG capsule Take 1 capsule (100 mg total) by mouth 2 (two) times daily. 20 capsule Francene Finders, PA-C      PDMP not reviewed this encounter.   Francene Finders, PA-C 06/10/22 1913

## 2022-06-10 NOTE — ED Triage Notes (Signed)
Pt c/o abrasion to left forearm on Tuesday. Concerned for puss under a scab.

## 2022-06-17 ENCOUNTER — Ambulatory Visit (INDEPENDENT_AMBULATORY_CARE_PROVIDER_SITE_OTHER): Payer: Self-pay | Admitting: Family

## 2022-06-17 VITALS — BP 127/80 | HR 61 | Temp 98.3°F | Resp 16 | Wt 170.0 lb

## 2022-06-17 DIAGNOSIS — I10 Essential (primary) hypertension: Secondary | ICD-10-CM

## 2022-06-17 DIAGNOSIS — E1010 Type 1 diabetes mellitus with ketoacidosis without coma: Secondary | ICD-10-CM

## 2022-06-17 MED ORDER — TRIAMTERENE-HCTZ 37.5-25 MG PO TABS: 1.0000 | ORAL_TABLET | Freq: Every day | ORAL | 3 refills | Status: DC

## 2022-06-17 MED ORDER — LOSARTAN POTASSIUM 100 MG PO TABS: 100.0000 mg | ORAL_TABLET | Freq: Every day | ORAL | 3 refills | Status: DC

## 2022-06-17 NOTE — Patient Instructions (Addendum)
Please call Endocrinology referral. [facility] Address: [address] [place], [place]  [address] Phone:  [phone]    Triamterene; Hydrochlorothiazide Capsules or Tablets What is this medication? TRIAMTERENE; HYDROCHLOROTHIAZIDE (trye AM ter een; hye droe klor oh THYE a zide) treats high blood pressure. It helps your kidneys remove more fluid and salt from your blood through the urine. It is a combination of two diuretics. This medicine may be used for other purposes; ask your health care provider or pharmacist if you have questions. COMMON BRAND NAME(S): Dyazide, Maxzide, Maxzide-25 What should I tell my care team before I take this medication? They need to know if you have any of these conditions: High blood sugar (diabetes) Immune system problems, like lupus Kidney disease or stones Liver disease Small amount of urine or difficulty passing urine An unusual or allergic reaction to triamterene, hydrochlorothiazide, sulfa drugs, other medications, foods, dyes, or preservatives Pregnant or trying to get pregnant Breast-feeding How should I use this medication? Take this medication by mouth. Take it as directed on the prescription label at the same time every day. You can take it with or without food. If it upsets your stomach, take it with food. Keep taking it unless your care team tells you to stop. Talk to your care team about the use of this medication in children. Special care may be needed. Overdosage: If you think you have taken too much of this medicine contact a poison control center or emergency room at once. NOTE: This medicine is only for you. Do not share this medicine with others. What if I miss a dose? If you miss a dose, take it as soon as you can. If it is almost time for your next dose, take only that dose. Do not take double or extra doses. What may interact with this medication? Do not take this medication with any of the  following: Cidofovir Dofetilide Eplerenone Potassium supplements Tranylcypromine This medication may also interact with the following: Certain medications for blood pressure, heart disease like benazepril, lisinopril, losartan, valsartan Lithium Medications for diabetes Medications that relax muscles for surgery NSAIDs, medications for pain and inflammation, like ibuprofen or naproxen Other diuretics Penicillin G potassium This list may not describe all possible interactions. Give your health care provider a list of all the medicines, herbs, non-prescription drugs, or dietary supplements you use. Also tell them if you smoke, drink alcohol, or use illegal drugs. Some items may interact with your medicine. What should I watch for while using this medication? Visit your care team for regular check-ups. You will need lab work done before you start this medication and regularly while you are taking it. Check your blood pressure regularly. Ask your care team what your blood pressure should be, and when you should contact them. This medication may increase blood sugar. Ask your care team if changes in diet or medications are needed if you have diabetes. You may need to be on a special diet while taking this medication. Ask your care team. Also, ask how many glasses of fluid you need to drink a day. You must not get dehydrated. You may get drowsy or dizzy. Do not drive, use machinery, or do anything that needs mental alertness until you know how this medication affects you. Do not stand or sit up quickly, especially if you are an older patient. This reduces the risk of dizzy or fainting spells. Alcohol may interfere with the effect of this medication. Avoid or limit alcoholic drinks. Talk to  your care team about your risk of skin cancer. You may be more at risk for skin cancer if you take this medication. This medication can make you more sensitive to the sun. Keep out of the sun. If you cannot avoid  being in the sun, wear protective clothing and use sunscreen. Do not use sun lamps or tanning beds/booths. What side effects may I notice from receiving this medication? Side effects that you should report to your care team as soon as possible: Allergic reactions--skin rash, itching, hives, swelling of the face, lips, tongue, or throat Dehydration--increased thirst, dry mouth, feeling faint or lightheaded, headache, dark yellow or brown urine Gout--severe pain, redness, warmth, or swelling in joints such as the big toe High potassium level--muscle weakness, fast or irregular heartbeat Kidney injury--decrease in the amount of urine, swelling of the ankles, hands, or feet Kidney stones--blood in the urine, pain or trouble passing urine, pain in the lower back or sides Low blood pressure--dizziness, feeling faint or lightheaded, blurry vision Sudden eye pain or change in vision, such as blurry vision, seeing halos around lights, vision loss Side effects that usually do not require medical attention (report to your care team if they continue or are bothersome): Change in sex drive or performance Headache Upset stomach This list may not describe all possible side effects. Call your doctor for medical advice about side effects. You may report side effects to FDA at 1-800-FDA-1088. Where should I keep my medication? Keep out of the reach of children and pets. Store at room temperature between 20 and 25 degrees C (68 and 77 degrees F). Protect from light. Throw away any unused medication after the expiration date. NOTE: This sheet is a summary. It may not cover all possible information. If you have questions about this medicine, talk to your doctor, pharmacist, or health care provider.  2023 Elsevier/Gold Standard (2020-10-14 00:00:00)

## 2022-06-17 NOTE — Progress Notes (Signed)
.  Pt presents for chronic care management  -has not been to Endo due other family factors

## 2022-06-18 LAB — CMP14+EGFR
ALT: 10 IU/L (ref 0–32)
AST: 12 IU/L (ref 0–40)
Albumin/Globulin Ratio: 1.8 (ref 1.2–2.2)
Albumin: 4.4 g/dL (ref 3.9–4.9)
Alkaline Phosphatase: 101 IU/L (ref 44–121)
BUN/Creatinine Ratio: 19 (ref 12–28)
BUN: 19 mg/dL (ref 8–27)
Bilirubin Total: 0.4 mg/dL (ref 0.0–1.2)
CO2: 22 mmol/L (ref 20–29)
Calcium: 9.4 mg/dL (ref 8.7–10.3)
Chloride: 101 mmol/L (ref 96–106)
Creatinine, Ser: 0.98 mg/dL (ref 0.57–1.00)
Globulin, Total: 2.5 g/dL (ref 1.5–4.5)
Glucose: 104 mg/dL — ABNORMAL HIGH (ref 70–99)
Potassium: 4.5 mmol/L (ref 3.5–5.2)
Sodium: 139 mmol/L (ref 134–144)
Total Protein: 6.9 g/dL (ref 6.0–8.5)
eGFR: 65 mL/min/{1.73_m2} (ref >59)

## 2022-06-18 NOTE — Progress Notes (Signed)
Liver function normal.

## 2022-07-04 ENCOUNTER — Other Ambulatory Visit: Payer: Self-pay

## 2022-07-04 ENCOUNTER — Other Ambulatory Visit: Payer: Self-pay | Admitting: Family

## 2022-07-04 DIAGNOSIS — E1010 Type 1 diabetes mellitus with ketoacidosis without coma: Secondary | ICD-10-CM

## 2022-07-04 DIAGNOSIS — K219 Gastro-esophageal reflux disease without esophagitis: Secondary | ICD-10-CM

## 2022-07-04 MED ORDER — BASAGLAR KWIKPEN 100 UNIT/ML ~~LOC~~ SOPN
30.0000 [IU] | PEN_INJECTOR | Freq: Every day | SUBCUTANEOUS | 2 refills | Status: DC
  Filled 2022-07-04: qty 9, 30d supply, fill #0
  Filled 2022-08-04: qty 9, 30d supply, fill #1
  Filled 2022-09-12: qty 9, 30d supply, fill #2

## 2022-07-04 MED ORDER — OMEPRAZOLE 40 MG PO CPDR
40.0000 mg | DELAYED_RELEASE_CAPSULE | Freq: Every day | ORAL | 2 refills | Status: DC
  Filled 2022-07-04: qty 30, 30d supply, fill #0
  Filled 2022-08-04 (×2): qty 30, 30d supply, fill #1
  Filled 2022-10-06: qty 30, 30d supply, fill #2

## 2022-07-04 NOTE — Telephone Encounter (Signed)
Order complete. 

## 2022-07-05 ENCOUNTER — Other Ambulatory Visit: Payer: Self-pay

## 2022-07-12 ENCOUNTER — Other Ambulatory Visit: Payer: Self-pay

## 2022-07-31 IMAGING — DX DG CHEST 1V PORT
1 series · 1 of 1 positions shown · non-contrast
Comparison: None.

CLINICAL DATA: Vomiting.

EXAM:
PORTABLE CHEST 1 VIEW

[chest ap]
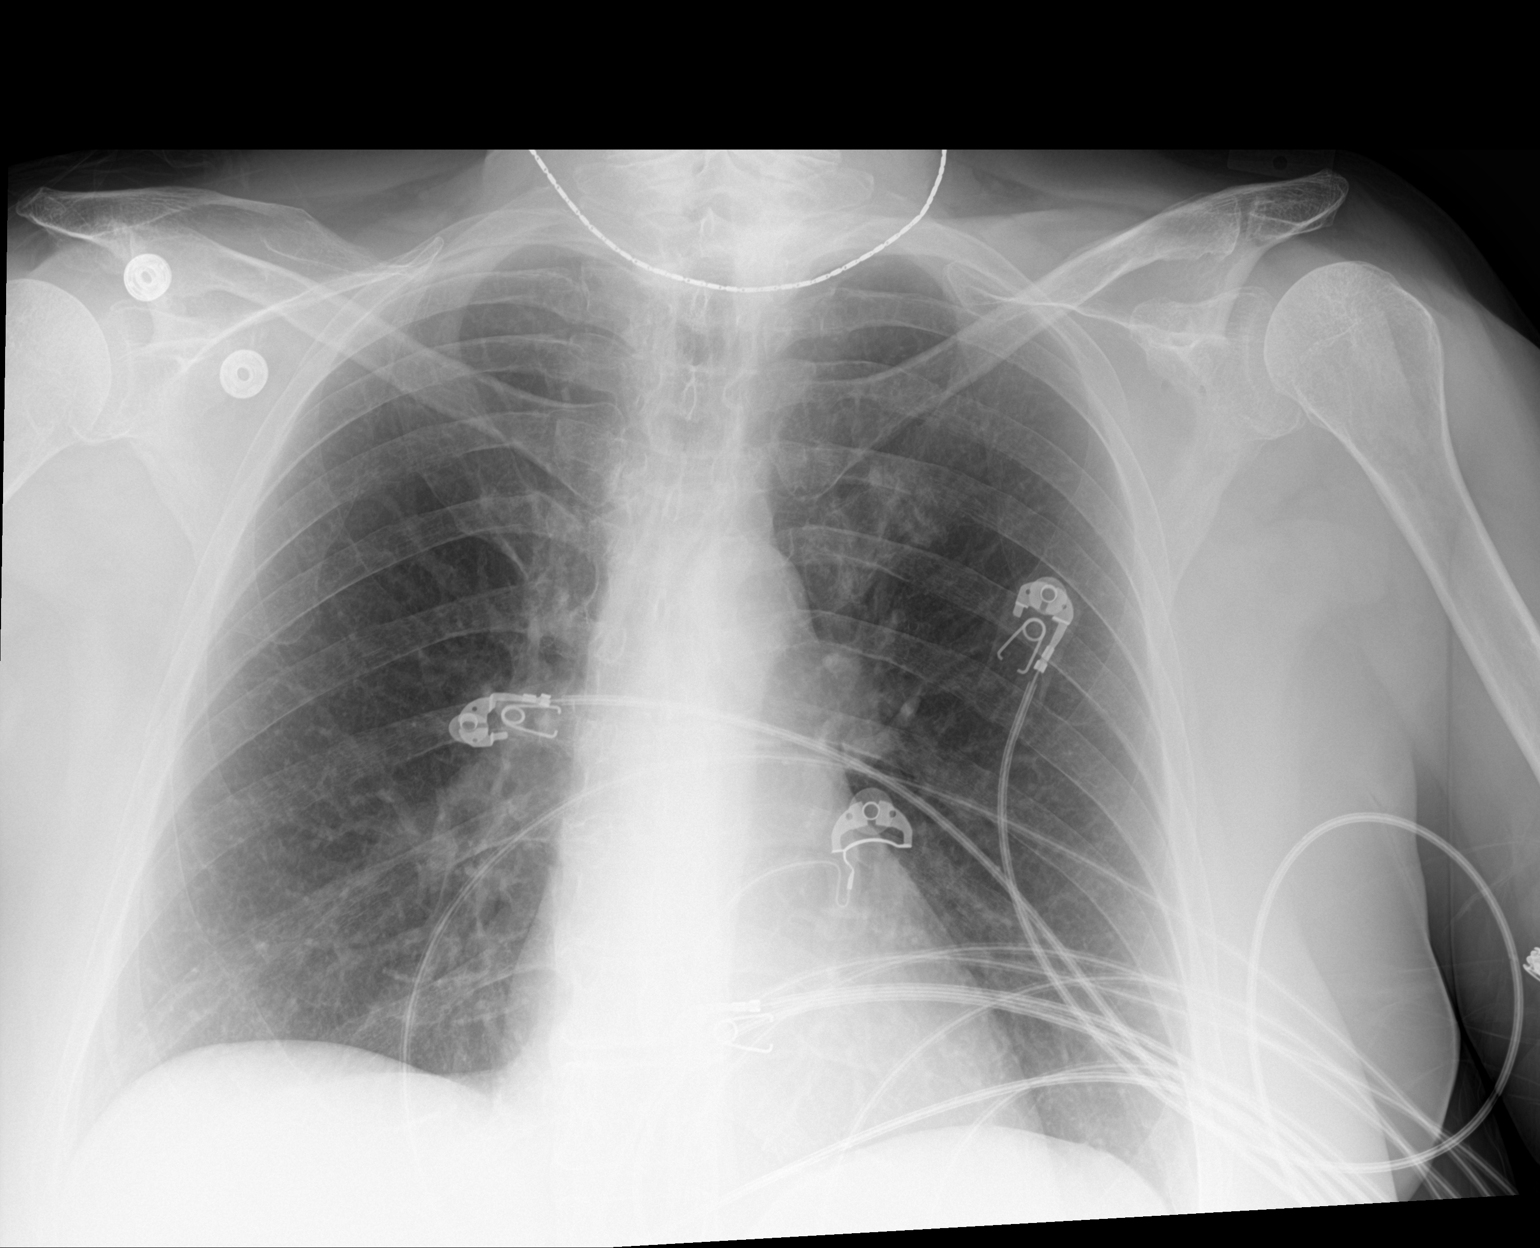

[1 of 1 positions shown; findings below may reference images not displayed]

FINDINGS: The heart size and mediastinal contours are within normal limits.
Both lungs are clear. The visualized skeletal structures are
unremarkable.
IMPRESSION: No active disease.

## 2022-08-03 ENCOUNTER — Ambulatory Visit: Payer: Self-pay | Admitting: Nurse Practitioner

## 2022-08-04 ENCOUNTER — Other Ambulatory Visit: Payer: Self-pay | Admitting: Family

## 2022-08-04 ENCOUNTER — Other Ambulatory Visit: Payer: Self-pay

## 2022-08-04 DIAGNOSIS — F419 Anxiety disorder, unspecified: Secondary | ICD-10-CM

## 2022-08-04 DIAGNOSIS — E1010 Type 1 diabetes mellitus with ketoacidosis without coma: Secondary | ICD-10-CM

## 2022-08-05 ENCOUNTER — Other Ambulatory Visit: Payer: Self-pay

## 2022-08-05 MED ORDER — SERTRALINE HCL 100 MG PO TABS
100.0000 mg | ORAL_TABLET | Freq: Every day | ORAL | 0 refills | Status: DC
  Filled 2022-08-05: qty 90, 90d supply, fill #0

## 2022-08-05 MED ORDER — INSULIN LISPRO 100 UNIT/ML IJ SOLN
8.0000 [IU] | Freq: Three times a day (TID) | INTRAMUSCULAR | 0 refills | Status: DC
  Filled 2022-08-05: qty 10, 28d supply, fill #0
  Filled 2022-09-12: qty 10, 28d supply, fill #1

## 2022-08-05 NOTE — Telephone Encounter (Signed)
Requested Prescriptions  Pending Prescriptions Disp Refills  . sertraline (ZOLOFT) 100 MG tablet 90 tablet 0    Sig: Take 1 tablet (100 mg total) by mouth daily for 90 doses.     Psychiatry:  Antidepressants - SSRI - sertraline Passed - 08/04/2022  4:22 PM      Passed - AST in normal range and within 360 days    AST  Date Value Ref Range Status  06/17/2022 12 0 - 40 IU/L Final         Passed - ALT in normal range and within 360 days    ALT  Date Value Ref Range Status  06/17/2022 10 0 - 32 IU/L Final         Passed - Completed PHQ-2 or PHQ-9 in the last 360 days      Passed - Valid encounter within last 6 months    Recent Outpatient Visits          1 month ago Essential (primary) hypertension   Primary Care at [facility], Amy J, NP   5 months ago Essential (primary) hypertension   Primary Care at [facility], Amy J, NP   8 months ago Annual physical exam   Primary Care at [facility], Amy J, NP   11 months ago Type 1 diabetes mellitus with ketoacidosis without coma [facility])   Primary Care at [facility], Amy J, NP   1 year ago Encounter to establish care   Primary Care at [facility], Flonnie Hailstone, NP      Future Appointments            In 2 months Camillia Herter, NP Primary Care at [facility]           . insulin lispro (HUMALOG) 100 UNIT/ML injection 20 mL 0    Sig: Inject 0.08 mLs (8 Units total) into the skin 3 (three) times daily with meals.     Endocrinology:  Diabetes - Insulins Failed - 08/04/2022  4:22 PM      Failed - HBA1C is between 0 and 7.9 and within 180 days    Hemoglobin A1C  Date Value Ref Range Status  11/23/2021 7.4 (A) 4.0 - 5.6 % Final   Hgb A1c MFr Bld  Date Value Ref Range Status  07/15/2021 8.6 (H) 4.8 - 5.6 % Final    Comment:    (NOTE)         Prediabetes: 5.7 - 6.4         Diabetes: >6.4         Glycemic control for adults with diabetes: <7.0          Passed - Valid  encounter within last 6 months    Recent Outpatient Visits          1 month ago Essential (primary) hypertension   Primary Care at [facility], Amy J, NP   5 months ago Essential (primary) hypertension   Primary Care at [facility], Amy J, NP   8 months ago Annual physical exam   Primary Care at [facility], Amy J, NP   11 months ago Type 1 diabetes mellitus with ketoacidosis without coma [facility])   Primary Care at [facility], Amy J, NP   1 year ago Encounter to establish care   Primary Care at [facility], Flonnie Hailstone, NP      Future Appointments  In 2 months Camillia Herter, NP Primary Care at [facility]

## 2022-08-08 ENCOUNTER — Other Ambulatory Visit: Payer: Self-pay

## 2022-09-12 ENCOUNTER — Other Ambulatory Visit: Payer: Self-pay

## 2022-10-05 NOTE — Progress Notes (Signed)
Erroneous encounter-disregard

## 2022-10-06 ENCOUNTER — Other Ambulatory Visit: Payer: Self-pay

## 2022-10-06 ENCOUNTER — Other Ambulatory Visit: Payer: Self-pay | Admitting: Family

## 2022-10-06 DIAGNOSIS — I10 Essential (primary) hypertension: Secondary | ICD-10-CM

## 2022-10-06 DIAGNOSIS — E1010 Type 1 diabetes mellitus with ketoacidosis without coma: Secondary | ICD-10-CM

## 2022-10-06 DIAGNOSIS — F419 Anxiety disorder, unspecified: Secondary | ICD-10-CM

## 2022-10-06 DIAGNOSIS — E785 Hyperlipidemia, unspecified: Secondary | ICD-10-CM

## 2022-10-06 NOTE — Telephone Encounter (Signed)
Requested Prescriptions  Pending Prescriptions Disp Refills   atorvastatin (LIPITOR) 40 MG tablet 120 tablet     Sig: Take 1 tablet (40 mg total) by mouth every morning.     Cardiovascular:  Antilipid - Statins Failed - 10/06/2022  1:45 PM      Failed - Lipid Panel in normal range within the last 12 months    Cholesterol, Total  Date Value Ref Range Status  11/23/2021 164 100 - 199 mg/dL Final   LDL Chol Calc (NIH)  Date Value Ref Range Status  11/23/2021 86 0 - 99 mg/dL Final   HDL  Date Value Ref Range Status  11/23/2021 63 >39 mg/dL Final   Triglycerides  Date Value Ref Range Status  11/23/2021 79 0 - 149 mg/dL Final         Passed - Patient is not pregnant      Passed - Valid encounter within last 12 months    Recent Outpatient Visits           3 months ago Essential (primary) hypertension   Primary Care at [facility], Amy J, NP   7 months ago Essential (primary) hypertension   [facility] at [facility], Amy J, NP   10 months ago Annual physical exam   Primary Care at [facility], Amy J, NP   1 year ago Type 1 diabetes mellitus with ketoacidosis without coma [facility])   Primary Care at [facility], Amy J, NP   1 year ago Encounter to establish care   Primary Care at [facility], Amy J, NP       Future Appointments             In 4 days Camillia Herter, NP Primary Care at [facility]             losartan (COZAAR) 100 MG tablet 120 tablet     Sig: Take 1 tablet (100 mg total) by mouth daily.     Cardiovascular:  Angiotensin Receptor Blockers Passed - 10/06/2022  1:45 PM      Passed - Cr in normal range and within 180 days    Creatinine, Ser  Date Value Ref Range Status  06/17/2022 0.98 0.57 - 1.00 mg/dL Final         Passed - K in normal range and within 180 days    Potassium  Date Value Ref Range Status  06/17/2022 4.5 3.5 - 5.2 mmol/L Final         Passed - Patient is not pregnant       Passed - Last BP in normal range    BP Readings from Last 1 Encounters:  06/17/22 127/80         Passed - Valid encounter within last 6 months    Recent Outpatient Visits           3 months ago Essential (primary) hypertension   Primary Care at [facility], Amy J, NP   7 months ago Essential (primary) hypertension   Primary Care at [facility], Amy J, NP   10 months ago Annual physical exam   Primary Care at [facility] [place], NP   1 year ago Type 1 diabetes mellitus with ketoacidosis without coma [facility])   Primary Care at [facility], Amy J, NP   1 year ago Encounter to establish care   Primary Care at [facility], Flonnie Hailstone, NP  Future Appointments             In 4 days Camillia Herter, NP Primary Care at [facility] Prescriptions Disp Refills   insulin lispro (HUMALOG) 100 UNIT/ML injection 20 mL     Sig: Inject 0.08 mLs (8 Units total) into the skin 3 (three) times daily with meals.     Endocrinology:  Diabetes - Insulins Failed - 10/06/2022  1:45 PM      Failed - HBA1C is between 0 and 7.9 and within 180 days    Hemoglobin A1C  Date Value Ref Range Status  11/23/2021 7.4 (A) 4.0 - 5.6 % Final   Hgb A1c MFr Bld  Date Value Ref Range Status  07/15/2021 8.6 (H) 4.8 - 5.6 % Final    Comment:    (NOTE)         Prediabetes: 5.7 - 6.4         Diabetes: >6.4         Glycemic control for adults with diabetes: <7.0          Passed - Valid encounter within last 6 months    Recent Outpatient Visits           3 months ago Essential (primary) hypertension   Primary Care at [facility], Amy J, NP   7 months ago Essential (primary) hypertension   Primary Care at [facility], Amy J, NP   10 months ago Annual physical exam   Primary Care at [facility], Amy J, NP   1 year ago Type 1 diabetes mellitus with ketoacidosis without coma [facility])    Primary Care at [facility], Amy J, NP   1 year ago Encounter to establish care   Primary Care at [facility], Flonnie Hailstone, NP       Future Appointments             In 4 days Camillia Herter, NP Primary Care at [facility]             sertraline (ZOLOFT) 100 MG tablet 90 tablet     Sig: Take 1 tablet (100 mg total) by mouth daily for 90 doses.     Psychiatry:  Antidepressants - SSRI - sertraline Passed - 10/06/2022  1:45 PM      Passed - AST in normal range and within 360 days    AST  Date Value Ref Range Status  06/17/2022 12 0 - 40 IU/L Final         Passed - ALT in normal range and within 360 days    ALT  Date Value Ref Range Status  06/17/2022 10 0 - 32 IU/L Final         Passed - Completed PHQ-2 or PHQ-9 in the last 360 days      Passed - Valid encounter within last 6 months    Recent Outpatient Visits           3 months ago Essential (primary) hypertension   Primary Care at [facility], Amy J, NP   7 months ago Essential (primary) hypertension   Primary Care at [facility], Amy J, NP   10 months ago Annual physical exam   Primary Care at [facility], Amy J, NP   1 year ago Type 1 diabetes mellitus with ketoacidosis without coma ([place])   Primary Care at [facility], [place], NP   1 year ago Encounter to establish care   Primary Care at [facility], Flonnie Hailstone, NP       Future Appointments             In 4 days Camillia Herter, NP Primary Care at [facility]

## 2022-10-06 NOTE — Telephone Encounter (Signed)
Requested medication (s) are due for refill today: yes  Requested medication (s) are on the active medication list: yes  Last refill:  07/04/22 #9 ml 2 refills  Future visit scheduled: yes in 4 days  Notes to clinic:  protocol failed. Last lab 11/23/21. Do you want to refill Rx?     Requested Prescriptions  Pending Prescriptions Disp Refills   Insulin Glargine (BASAGLAR KWIKPEN) 100 UNIT/ML 9 mL 2    Sig: Inject 30 Units into the skin daily.     Endocrinology:  Diabetes - Insulins Failed - 10/06/2022  1:44 PM      Failed - HBA1C is between 0 and 7.9 and within 180 days    Hemoglobin A1C  Date Value Ref Range Status  11/23/2021 7.4 (A) 4.0 - 5.6 % Final   Hgb A1c MFr Bld  Date Value Ref Range Status  07/15/2021 8.6 (H) 4.8 - 5.6 % Final    Comment:    (NOTE)         Prediabetes: 5.7 - 6.4         Diabetes: >6.4         Glycemic control for adults with diabetes: <7.0          Passed - Valid encounter within last 6 months    Recent Outpatient Visits           3 months ago Essential (primary) hypertension   Primary Care at [facility], Amy J, NP   7 months ago Essential (primary) hypertension   Primary Care at [facility], Amy J, NP   10 months ago Annual physical exam   Primary Care at [facility], Amy J, NP   1 year ago Type 1 diabetes mellitus with ketoacidosis without coma [facility])   Primary Care at [facility], Amy J, NP   1 year ago Encounter to establish care   Primary Care at [facility], Flonnie Hailstone, NP       Future Appointments             In 4 days Camillia Herter, NP Primary Care at [facility]

## 2022-10-06 NOTE — Telephone Encounter (Signed)
Requested medication (s) are due for refill today: yes  Requested medication (s) are on the active medication list: yes  Last refill:  losartan 06/17/22 #30 3 RF    atorvastatin: 11/24/21   Future visit scheduled: yes  Notes to clinic:  losartan expires 10/15/22 (pt has appt 10/10/22) atorvastatin expired 07/11/22   Requested Prescriptions  Pending Prescriptions Disp Refills   atorvastatin (LIPITOR) 40 MG tablet 120 tablet     Sig: Take 1 tablet (40 mg total) by mouth every morning.     Cardiovascular:  Antilipid - Statins Failed - 10/06/2022  1:45 PM      Failed - Lipid Panel in normal range within the last 12 months    Cholesterol, Total  Date Value Ref Range Status  11/23/2021 164 100 - 199 mg/dL Final   LDL Chol Calc (NIH)  Date Value Ref Range Status  11/23/2021 86 0 - 99 mg/dL Final   HDL  Date Value Ref Range Status  11/23/2021 63 >39 mg/dL Final   Triglycerides  Date Value Ref Range Status  11/23/2021 79 0 - 149 mg/dL Final         Passed - Patient is not pregnant      Passed - Valid encounter within last 12 months    Recent Outpatient Visits           3 months ago Essential (primary) hypertension   Primary Care at [facility], Amy J, NP   7 months ago Essential (primary) hypertension   Primary Care at [facility], Amy J, NP   10 months ago Annual physical exam   Primary Care at [facility], Amy J, NP   1 year ago Type 1 diabetes mellitus with ketoacidosis without coma [facility])   Primary Care at [facility], Amy J, NP   1 year ago Encounter to establish care   Primary Care at [facility], Amy J, NP       Future Appointments             In 4 days Camillia Herter, NP Primary Care at [facility]             losartan (COZAAR) 100 MG tablet 120 tablet     Sig: Take 1 tablet (100 mg total) by mouth daily.     Cardiovascular:  Angiotensin Receptor Blockers Passed - 10/06/2022  1:45 PM       Passed - Cr in normal range and within 180 days    Creatinine, Ser  Date Value Ref Range Status  06/17/2022 0.98 0.57 - 1.00 mg/dL Final         Passed - K in normal range and within 180 days    Potassium  Date Value Ref Range Status  06/17/2022 4.5 3.5 - 5.2 mmol/L Final         Passed - Patient is not pregnant      Passed - Last BP in normal range    BP Readings from Last 1 Encounters:  06/17/22 127/80         Passed - Valid encounter within last 6 months    Recent Outpatient Visits           3 months ago Essential (primary) hypertension   Primary Care at [facility], Amy J, NP   7 months ago Essential (primary) hypertension   Primary Care at [facility], Amy J, NP   10 months ago Annual physical exam  Primary Care at [facility], Amy J, NP   1 year ago Type 1 diabetes mellitus with ketoacidosis without coma [facility])   Primary Care at [facility], Amy J, NP   1 year ago Encounter to establish care   Primary Care at [facility], Flonnie Hailstone, NP       Future Appointments             In 4 days Camillia Herter, NP Primary Care at [place] Refills   insulin lispro (HUMALOG) 100 UNIT/ML injection 20 mL     Sig: Inject 0.08 mLs (8 Units total) into the skin 3 (three) times daily with meals.     Endocrinology:  Diabetes - Insulins Failed - 10/06/2022  1:45 PM      Failed - HBA1C is between 0 and 7.9 and within 180 days    Hemoglobin A1C  Date Value Ref Range Status  11/23/2021 7.4 (A) 4.0 - 5.6 % Final   Hgb A1c MFr Bld  Date Value Ref Range Status  07/15/2021 8.6 (H) 4.8 - 5.6 % Final    Comment:    (NOTE)         Prediabetes: 5.7 - 6.4         Diabetes: >6.4         Glycemic control for adults with diabetes: <7.0          Passed - Valid encounter within last 6 months    Recent Outpatient Visits           3 months ago Essential (primary) hypertension    Primary Care at [facility], Amy J, NP   7 months ago Essential (primary) hypertension   Primary Care at [facility], Amy J, NP   10 months ago Annual physical exam   Primary Care at [facility], Amy J, NP   1 year ago Type 1 diabetes mellitus with ketoacidosis without coma [facility])   Primary Care at [facility], Amy J, NP   1 year ago Encounter to establish care   Primary Care at [facility], Flonnie Hailstone, NP       Future Appointments             In 4 days Camillia Herter, NP Primary Care at [facility]             sertraline (ZOLOFT) 100 MG tablet 90 tablet     Sig: Take 1 tablet (100 mg total) by mouth daily for 90 doses.     Psychiatry:  Antidepressants - SSRI - sertraline Passed - 10/06/2022  1:45 PM      Passed - AST in normal range and within 360 days    AST  Date Value Ref Range Status  06/17/2022 12 0 - 40 IU/L Final         Passed - ALT in normal range and within 360 days    ALT  Date Value Ref Range Status  06/17/2022 10 0 - 32 IU/L Final         Passed - Completed PHQ-2 or PHQ-9 in the last 360 days      Passed - Valid encounter within last 6 months    Recent Outpatient Visits           3 months ago Essential (primary) hypertension   Primary Care at [facility], [place]  J, NP   7 months ago Essential (primary) hypertension   Primary Care at [facility], Amy J, NP   10 months ago Annual physical exam   Primary Care at [facility], Amy J, NP   1 year ago Type 1 diabetes mellitus with ketoacidosis without coma [facility])   Primary Care at [facility], [place], NP   1 year ago Encounter to establish care   Primary Care at [facility], Flonnie Hailstone, NP       Future Appointments             In 4 days Camillia Herter, NP Primary Care at [facility]

## 2022-10-10 ENCOUNTER — Other Ambulatory Visit: Payer: Self-pay | Admitting: Family

## 2022-10-10 ENCOUNTER — Telehealth: Payer: Self-pay | Admitting: Family

## 2022-10-10 ENCOUNTER — Encounter: Payer: Self-pay | Admitting: Family

## 2022-10-10 ENCOUNTER — Other Ambulatory Visit: Payer: Self-pay

## 2022-10-10 DIAGNOSIS — K219 Gastro-esophageal reflux disease without esophagitis: Secondary | ICD-10-CM

## 2022-10-10 DIAGNOSIS — E785 Hyperlipidemia, unspecified: Secondary | ICD-10-CM

## 2022-10-10 DIAGNOSIS — E1065 Type 1 diabetes mellitus with hyperglycemia: Secondary | ICD-10-CM

## 2022-10-10 DIAGNOSIS — I10 Essential (primary) hypertension: Secondary | ICD-10-CM

## 2022-10-10 DIAGNOSIS — F32A Depression, unspecified: Secondary | ICD-10-CM

## 2022-10-10 MED ORDER — BASAGLAR KWIKPEN 100 UNIT/ML ~~LOC~~ SOPN
30.0000 [IU] | PEN_INJECTOR | Freq: Every day | SUBCUTANEOUS | 0 refills | Status: DC
  Filled 2022-10-10: qty 9, 30d supply, fill #0

## 2022-10-10 NOTE — Telephone Encounter (Signed)
Order complete. 

## 2022-10-10 NOTE — Telephone Encounter (Signed)
Att to contact pt no ans lvm to call back

## 2022-10-31 NOTE — Progress Notes (Signed)
Patient ID: Carrie Mcpherson, female    DOB: 12/20/1960  MRN: [id number]  CC: Chronic Care Management   Subjective: Carrie Mcpherson is a 62 y.o. female who presents for chronic care management.   Her concerns today include:  HTN - Losartan, Triamterene-Hydrochlorothiazide  DM - estab with Endo?  Patient Active Problem List   Diagnosis Date Noted   CKD (chronic kidney disease) 07/20/2021   DKA, type 1 ([place]) 07/15/2021   Type 1 diabetes mellitus with ketoacidosis without coma ([place]) 10/26/2019   DKA (diabetic ketoacidoses) 10/07/2015   Essential hypertension 10/07/2015   GERD (gastroesophageal reflux disease) 10/07/2015   Depression 10/07/2015   AKI (acute kidney injury) ([place]) 10/07/2015   Acute pharyngitis 10/07/2015   Tobacco abuse 10/07/2015     Current Outpatient Medications on File Prior to Visit  Medication Sig Dispense Refill   aspirin EC 81 MG tablet Take 81 mg by mouth daily.     atorvastatin (LIPITOR) 40 MG tablet Take 1 tablet (40 mg total) by mouth every morning. 120 tablet 0   doxycycline (VIBRAMYCIN) 100 MG capsule Take 1 capsule (100 mg total) by mouth 2 (two) times daily. 20 capsule 0   Insulin Glargine (BASAGLAR KWIKPEN) 100 UNIT/ML Inject 30 Units into the skin daily. 15 mL 0   insulin lispro (HUMALOG) 100 UNIT/ML injection Inject 0.08 mLs (8 Units total) into the skin 3 (three) times daily with meals. 20 mL 0   Insulin Syringe-Needle U-100 31G X 5/16" 0.5 ML MISC USE TO INJECT Lantus 20 units daily and Humalog 8 units 3 TIMES DAILY with meals 100 each 1   losartan (COZAAR) 100 MG tablet Take 1 tablet (100 mg total) by mouth daily. 30 tablet 3   Multiple Vitamins-Minerals (MULTIVITAMIN WITH MINERALS) tablet Take 1 tablet by mouth 3 (three) times a week.      omeprazole (PRILOSEC) 40 MG capsule Take 1 capsule (40 mg total) by mouth daily. 30 capsule 2   ondansetron (ZOFRAN) 4 MG tablet Take 1 tablet (4 mg total) by mouth every 6 (six) hours as needed for nausea. 20  tablet 0   sertraline (ZOLOFT) 100 MG tablet Take 1 tablet (100 mg total) by mouth daily for 90 doses. 90 tablet 0   triamterene-hydrochlorothiazide (MAXZIDE-25) 37.5-25 MG tablet Take 1 tablet by mouth daily. 30 tablet 3   vitamin B-12 (CYANOCOBALAMIN) 500 MCG tablet Take 500 mcg by mouth daily.     No current facility-administered medications on file prior to visit.    Allergies  Allergen Reactions   Bactrim [Sulfamethoxazole-Trimethoprim] Itching    Social History   Socioeconomic History   Marital status: Single    Spouse name: Not on file   Number of children: Not on file   Years of education: Not on file   Highest education level: Not on file  Occupational History   Not on file  Tobacco Use   Smoking status: Every Day    Packs/day: 0.25    Years: 30.00    Total pack years: 7.50    Types: Cigarettes    Passive exposure: Current   Smokeless tobacco: Never  Vaping Use   Vaping Use: Never used  Substance and Sexual Activity   Alcohol use: Yes    Comment: occasional   Drug use: No   Sexual activity: Not on file  Other Topics Concern   Not on file  Social History Narrative   Not on file   Social Determinants of Health   Financial  Resource Strain: Not on file  Food Insecurity: Not on file  Transportation Needs: Not on file  Physical Activity: Not on file  Stress: Not on file  Social Connections: Not on file  Intimate Partner Violence: Not on file    Family History  Problem Relation Age of Onset   Hypertension Other    Breast cancer Maternal Aunt     Past Surgical History:  Procedure Laterality Date   COLONOSCOPY WITH PROPOFOL N/A 08/12/2014   Procedure: COLONOSCOPY WITH PROPOFOL;  Surgeon: Garlan Fair, MD;  Location: [facility];  Service: Endoscopy;  Laterality: N/A;   ROTATOR CUFF REPAIR Bilateral right 2004, left 1999    ROS: Review of Systems Negative except as stated above  PHYSICAL EXAM: There were no vitals taken for this  visit.  Physical Exam  {female adult master:310786} {female adult master:310785}     Latest Ref Rng & Units 06/17/2022   10:19 AM 11/23/2021   11:46 AM 07/19/2021    3:09 PM  CMP  Glucose 70 - 99 mg/dL 104   343   BUN 8 - 27 mg/dL 19   23   Creatinine 0.57 - 1.00 mg/dL 0.98   1.28   Sodium 134 - 144 mmol/L 139   136   Potassium 3.5 - 5.2 mmol/L 4.5   4.7   Chloride 96 - 106 mmol/L 101   95   CO2 20 - 29 mmol/L 22   24   Calcium 8.7 - 10.3 mg/dL 9.4   9.7   Total Protein 6.0 - 8.5 g/dL 6.9     Total Bilirubin 0.0 - 1.2 mg/dL 0.4     Alkaline Phos 44 - 121 IU/L 101     AST 0 - 40 IU/L 12  12    ALT 0 - 32 IU/L 10  8     Lipid Panel     Component Value Date/Time   CHOL 164 11/23/2021 1146   TRIG 79 11/23/2021 1146   HDL 63 11/23/2021 1146   CHOLHDL 2.6 11/23/2021 1146   CHOLHDL 3.2 Ratio 11/18/2010 2042   VLDL 23 11/18/2010 2042   LDLCALC 86 11/23/2021 1146    CBC    Component Value Date/Time   WBC 6.5 11/23/2021 1146   WBC 12.3 (H) 07/15/2021 0701   RBC 5.15 11/23/2021 1146   RBC 3.93 07/15/2021 0701   HGB 14.3 11/23/2021 1146   HCT 44.8 11/23/2021 1146   PLT 228 11/23/2021 1146   MCV 87 11/23/2021 1146   MCH 27.8 11/23/2021 1146   MCH 29.0 07/15/2021 0701   MCHC 31.9 11/23/2021 1146   MCHC 34.8 07/15/2021 0701   RDW 13.1 11/23/2021 1146   LYMPHSABS 0.8 10/07/2015 0500   MONOABS 0.3 10/07/2015 0500   EOSABS 0.0 10/07/2015 0500   BASOSABS 0.0 10/07/2015 0500    ASSESSMENT AND PLAN:  There are no diagnoses linked to this encounter.   Patient was given the opportunity to ask questions.  Patient verbalized understanding of the plan and was able to repeat key elements of the plan. Patient was given clear instructions to go to Emergency Department or return to medical center if symptoms don't improve, worsen, or new problems develop.The patient verbalized understanding.   No orders of the defined types were placed in this encounter.    Requested  Prescriptions    No prescriptions requested or ordered in this encounter    No follow-ups on file.  Camillia Herter, NP

## 2022-11-02 ENCOUNTER — Ambulatory Visit (INDEPENDENT_AMBULATORY_CARE_PROVIDER_SITE_OTHER): Payer: Commercial Managed Care - HMO | Admitting: Family

## 2022-11-02 ENCOUNTER — Encounter: Payer: Self-pay | Admitting: Family

## 2022-11-02 VITALS — BP 130/77 | HR 62 | Temp 98.3°F | Resp 16 | Ht 65.55 in | Wt 170.0 lb

## 2022-11-02 DIAGNOSIS — E1065 Type 1 diabetes mellitus with hyperglycemia: Secondary | ICD-10-CM | POA: Diagnosis not present

## 2022-11-02 DIAGNOSIS — F419 Anxiety disorder, unspecified: Secondary | ICD-10-CM | POA: Diagnosis not present

## 2022-11-02 DIAGNOSIS — F32A Depression, unspecified: Secondary | ICD-10-CM

## 2022-11-02 DIAGNOSIS — E785 Hyperlipidemia, unspecified: Secondary | ICD-10-CM

## 2022-11-02 DIAGNOSIS — I10 Essential (primary) hypertension: Secondary | ICD-10-CM

## 2022-11-02 LAB — POCT GLYCOSYLATED HEMOGLOBIN (HGB A1C): Hemoglobin A1C: 7.7 % — AB (ref 4.0–5.6)

## 2022-11-02 LAB — BASIC METABOLIC PANEL
BUN: 20 (ref 4–21)
CO2: 22 (ref 13–22)
Chloride: 101 (ref 99–108)
Creatinine: 0.9 (ref 0.5–1.1)
Glucose: 31
Potassium: 3.8 mEq/L (ref 3.5–5.1)
Sodium: 140 (ref 137–147)

## 2022-11-02 LAB — COMPREHENSIVE METABOLIC PANEL: Calcium: 9.3 (ref 8.7–10.7)

## 2022-11-02 MED ORDER — LOSARTAN POTASSIUM 100 MG PO TABS: 100.0000 mg | ORAL_TABLET | Freq: Every day | ORAL | 2 refills | Status: DC

## 2022-11-02 MED ORDER — SERTRALINE HCL 100 MG PO TABS: 100.0000 mg | ORAL_TABLET | Freq: Every day | ORAL | 2 refills | Status: DC

## 2022-11-02 MED ORDER — TRIAMTERENE-HCTZ 37.5-25 MG PO TABS: 1.0000 | ORAL_TABLET | Freq: Every day | ORAL | 2 refills | Status: DC

## 2022-11-02 MED ORDER — ATORVASTATIN CALCIUM 40 MG PO TABS: 40.0000 mg | ORAL_TABLET | ORAL | 2 refills | Status: DC

## 2022-11-02 NOTE — Progress Notes (Signed)
.  Pt presents for chronic care management   -needs refill on Sertraline, Atorvastatin

## 2022-11-03 ENCOUNTER — Ambulatory Visit: Payer: Self-pay | Admitting: *Deleted

## 2022-11-03 ENCOUNTER — Other Ambulatory Visit: Payer: Self-pay | Admitting: Family

## 2022-11-03 LAB — BASIC METABOLIC PANEL
BUN/Creatinine Ratio: 23 (ref 12–28)
BUN: 20 mg/dL (ref 8–27)
CO2: 22 mmol/L (ref 20–29)
Calcium: 9.3 mg/dL (ref 8.7–10.3)
Chloride: 101 mmol/L (ref 96–106)
Creatinine, Ser: 0.87 mg/dL (ref 0.57–1.00)
Glucose: 31 mg/dL — CL (ref 70–99)
Potassium: 3.8 mmol/L (ref 3.5–5.2)
Sodium: 140 mmol/L (ref 134–144)
eGFR: 75 mL/min/{1.73_m2} (ref >59)

## 2022-11-03 NOTE — Telephone Encounter (Signed)
Requested medication (s) are due for refill today - provider review   Requested medication (s) are on the active medication list -yes  Future visit scheduled -yes  Last refill: 06/10/22 #20  Notes to clinic: non delegated Rx  Requested Prescriptions  Pending Prescriptions Disp Refills   doxycycline (VIBRAMYCIN) 100 MG capsule 20 capsule 0    Sig: Take 1 capsule (100 mg total) by mouth 2 (two) times daily.     Off-Protocol Failed - 11/03/2022 11:55 AM      Failed - Medication not assigned to a protocol, review manually.      Passed - Valid encounter within last 12 months    Recent Outpatient Visits           Yesterday Primary hypertension   Primary Care at [facility], Amy J, NP   4 months ago Essential (primary) hypertension   Primary Care at [facility], Amy J, NP   8 months ago Essential (primary) hypertension   Primary Care at [facility], Amy J, NP   11 months ago Annual physical exam   [facility] Care at [facility], Amy J, NP   1 year ago Type 1 diabetes mellitus with ketoacidosis without coma [facility])   Primary Care at [facility] [place], NP       Future Appointments             In 3 months Camillia Herter, NP Primary Care at [facility]               Requested Prescriptions  Pending Prescriptions Disp Refills   doxycycline (VIBRAMYCIN) 100 MG capsule 20 capsule 0    Sig: Take 1 capsule (100 mg total) by mouth 2 (two) times daily.     Off-Protocol Failed - 11/03/2022 11:55 AM      Failed - Medication not assigned to a protocol, review manually.      Passed - Valid encounter within last 12 months    Recent Outpatient Visits           Yesterday Primary hypertension   Primary Care at [facility], Amy J, NP   4 months ago Essential (primary) hypertension   Primary Care at [facility], Amy J, NP   8 months ago Essential (primary) hypertension   Primary Care at [facility], Amy J, NP   11 months ago Annual physical exam   Primary Care at [facility], Amy J, NP   1 year ago Type 1 diabetes mellitus with ketoacidosis without coma [facility])   Primary Care at [facility], Flonnie Hailstone, NP       Future Appointments             In 3 months Camillia Herter, NP Primary Care at [facility]

## 2022-11-03 NOTE — Telephone Encounter (Signed)
  Chief Complaint: [facility] calling critical glucose level of 31. Symptoms: N/A Frequency: N/A Pertinent Negatives: Patient denies N/A Disposition: '[]'$ ED /'[]'$ Urgent Care (no appt availability in office) / '[]'$ Appointment(In office/virtual)/ '[]'$  [place] [facility]/ '[]'$ Home Care/ '[]'$ Refused Recommended Disposition /'[]'$ [place] [facility]/  Follow-up with PCP Additional Notes: Sent a high priority message.   Attempted to call into office but no answer.   Secure message to Durene Fruits, NP was on "Do not disturb" so no message sent.    [facility] was given their fax number to [facility] and he was going to fax over the result also.

## 2022-11-03 NOTE — Telephone Encounter (Signed)
  Reason for Disposition  Lab or radiology calling with CRITICAL test results  Answer Assessment - Initial Assessment Questions 1. REASON FOR CALL or QUESTION: "What is your reason for calling today?" or "How can I best help you?" or "What question do you have that I can help answer?"     Jared with [facility] with critical lab   Glucose 31.   Message sent to [place] high priority.  Protocols used: Information Only Call - No Triage-A-AH, PCP Call - No Triage-A-AH

## 2022-11-03 NOTE — Telephone Encounter (Signed)
Pt stated that she had an appt on yesterday and forgot to ask for a refill of the following   Medication Refill - Medication: doxycycline (VIBRAMYCIN) 100 MG capsule   Has the patient contacted their pharmacy? No.  Preferred Pharmacy (with phone number or street name):  [place], [place] - [address] Phone: [phone]  Fax: [phone]     Has the patient been seen for an appointment in the last year OR does the patient have an upcoming appointment? Yes.    Agent: Please be advised that RX refills may take up to 3 business days. We ask that you follow-up with your pharmacy.

## 2022-11-11 ENCOUNTER — Other Ambulatory Visit: Payer: Self-pay | Admitting: Family

## 2022-11-11 DIAGNOSIS — E1010 Type 1 diabetes mellitus with ketoacidosis without coma: Secondary | ICD-10-CM

## 2022-11-11 NOTE — Telephone Encounter (Signed)
Medication Refill - Medication: Insulin Glargine (BASAGLAR KWIKPEN) 100 UNIT/ML and doxycycline (VIBRAMYCIN) 100 MG capsule   Has the patient contacted their pharmacy? No. Pt stated she was told she had to contact provider  Preferred Pharmacy (with phone number or street name):  [place], [place] - [address] Phone: [phone]  Fax: [phone]     Has the patient been seen for an appointment in the last year OR does the patient have an upcoming appointment? Yes.    Agent: Please be advised that RX refills may take up to 3 business days. We ask that you follow-up with your pharmacy.

## 2022-11-11 NOTE — Telephone Encounter (Signed)
Patient called, left VM to return the call to the office to let us know why she's needing the doxycycline refill. This is an antibiotic that was given at an UC visit, she will need to speak to NT if having symptoms.

## 2022-11-14 MED ORDER — BASAGLAR KWIKPEN 100 UNIT/ML ~~LOC~~ SOPN: 30.0000 [IU] | PEN_INJECTOR | Freq: Every day | SUBCUTANEOUS | 0 refills | Status: DC

## 2022-11-14 NOTE — Telephone Encounter (Signed)
Requested Prescriptions  Pending Prescriptions Disp Refills   Insulin Glargine (BASAGLAR KWIKPEN) 100 UNIT/ML 15 mL 0    Sig: Inject 30 Units into the skin daily.     Endocrinology:  Diabetes - Insulins Passed - 11/11/2022  5:13 PM      Passed - HBA1C is between 0 and 7.9 and within 180 days    Hemoglobin A1C  Date Value Ref Range Status  11/02/2022 7.7 (A) 4.0 - 5.6 % Final   Hgb A1c MFr Bld  Date Value Ref Range Status  07/15/2021 8.6 (H) 4.8 - 5.6 % Final    Comment:    (NOTE)         Prediabetes: 5.7 - 6.4         Diabetes: >6.4         Glycemic control for adults with diabetes: <7.0          Passed - Valid encounter within last 6 months    Recent Outpatient Visits           1 week ago Primary hypertension   [place] [facility], Amy J, NP   5 months ago Essential (primary) hypertension   [place] [facility] at [facility], Amy J, NP   9 months ago Essential (primary) hypertension   [place] [facility] at [facility], [place], NP   11 months ago Annual physical exam   [place] [facility], [place], NP   1 year ago Type 1 diabetes mellitus with ketoacidosis without coma [facility]   [place] [facility] at [facility], [place], NP       Future Appointments             In 2 months Camillia Herter, NP [place] at [facility]

## 2022-12-29 ENCOUNTER — Other Ambulatory Visit: Payer: Self-pay | Admitting: Family

## 2022-12-29 DIAGNOSIS — E1010 Type 1 diabetes mellitus with ketoacidosis without coma: Secondary | ICD-10-CM

## 2022-12-29 NOTE — Telephone Encounter (Signed)
Medication Refill - Medication:  Insulin Glargine (BASAGLAR KWIKPEN) 100 UNIT/ML  insulin lispro (HUMALOG) 100 UNIT/ML injection   Has the patient contacted their pharmacy? Yes.   (Agent: If no, request that the patient contact the pharmacy for the refill. If patient does not wish to contact the pharmacy document the reason why and proceed with request.) (Agent: If yes, when and what did the pharmacy advise?)  Preferred Pharmacy (with phone number or street name):  [place], [place] - [id number] Phone: [phone]  Fax: [phone]     Has the patient been seen for an appointment in the last year OR does the patient have an upcoming appointment? Yes.    Agent: Please be advised that RX refills may take up to 3 business days. We ask that you follow-up with your pharmacy.

## 2022-12-29 NOTE — Telephone Encounter (Signed)
Requested Prescriptions  Pending Prescriptions Disp Refills   Insulin Glargine (BASAGLAR KWIKPEN) 100 UNIT/ML [Pharmacy Med Name: Basaglar KwikPen 100 UNIT/ML Subcutaneous Solution Pen-injector] 15 mL 0    Sig: INJECT [place] DAILY     Endocrinology:  Diabetes - Insulins Passed - 12/29/2022  1:41 PM      Passed - HBA1C is between 0 and 7.9 and within 180 days    Hemoglobin A1C  Date Value Ref Range Status  11/02/2022 7.7 (A) 4.0 - 5.6 % Final   Hgb A1c MFr Bld  Date Value Ref Range Status  07/15/2021 8.6 (H) 4.8 - 5.6 % Final    Comment:    (NOTE)         Prediabetes: 5.7 - 6.4         Diabetes: >6.4         Glycemic control for adults with diabetes: <7.0          Passed - Valid encounter within last 6 months    Recent Outpatient Visits           1 month ago Primary hypertension   [place] [facility] at [facility], Amy J, NP   6 months ago Essential (primary) hypertension   [place] [facility] at [facility], [place], NP   10 months ago Essential (primary) hypertension   [place] [facility] at [facility], [place], NP   1 year ago Annual physical exam   [place] [facility] at [facility], [place], NP   1 year ago Type 1 diabetes mellitus with ketoacidosis without coma [facility])    Primary Care at [facility], [place], NP       Future Appointments             In 1 month Camillia Herter, NP [place] at [facility]

## 2022-12-30 ENCOUNTER — Other Ambulatory Visit: Payer: Self-pay | Admitting: Family

## 2022-12-30 DIAGNOSIS — E1010 Type 1 diabetes mellitus with ketoacidosis without coma: Secondary | ICD-10-CM

## 2022-12-30 MED ORDER — INSULIN LISPRO 100 UNIT/ML IJ SOLN: 8.0000 [IU] | Freq: Three times a day (TID) | INTRAMUSCULAR | 0 refills | Status: DC

## 2022-12-30 NOTE — Telephone Encounter (Signed)
Medication Refill - Medication: Humalog 100  please call in more than 1 month refills  Has the patient contacted their pharmacy? yes (Agent: If no, request that the patient contact the pharmacy for the refill. If patient does not wish to contact the pharmacy document the reason why and proceed with request.) (Agent: If yes, when and what did the pharmacy advise?)  Preferred Pharmacy (with phone number or street name): [facility] Has the patient been seen for an appointment in the last year OR does the patient have an upcoming appointment? Yes.    Agent: Please be advised that RX refills may take up to 3 business days. We ask that you follow-up with your pharmacy.

## 2022-12-30 NOTE — Telephone Encounter (Signed)
Requested Prescriptions  Pending Prescriptions Disp Refills   insulin lispro (HUMALOG) 100 UNIT/ML injection 20 mL 0    Sig: Inject 0.08 mLs (8 Units total) into the skin 3 (three) times daily with meals.     Endocrinology:  Diabetes - Insulins Passed - 12/30/2022  3:06 PM      Passed - HBA1C is between 0 and 7.9 and within 180 days    Hemoglobin A1C  Date Value Ref Range Status  11/02/2022 7.7 (A) 4.0 - 5.6 % Final   Hgb A1c MFr Bld  Date Value Ref Range Status  07/15/2021 8.6 (H) 4.8 - 5.6 % Final    Comment:    (NOTE)         Prediabetes: 5.7 - 6.4         Diabetes: >6.4         Glycemic control for adults with diabetes: <7.0          Passed - Valid encounter within last 6 months    Recent Outpatient Visits           1 month ago Primary hypertension   [place] [facility], Amy J, NP   6 months ago Essential (primary) hypertension   [place] [facility] at [facility], Amy J, NP   10 months ago Essential (primary) hypertension   [place] [facility] at [facility], [place], NP   1 year ago Annual physical exam   [place] [facility] at [facility], [place], NP   1 year ago Type 1 diabetes mellitus with ketoacidosis without coma [facility])   [place] [facility] at [facility], [place], NP       Future Appointments             In 1 month Camillia Herter, NP [place] at [facility]

## 2023-01-25 NOTE — Progress Notes (Signed)
Erroneous encounter-disregard

## 2023-01-27 ENCOUNTER — Encounter: Payer: Self-pay | Admitting: Family

## 2023-02-01 ENCOUNTER — Encounter: Payer: Commercial Managed Care - HMO | Admitting: Family

## 2023-02-01 DIAGNOSIS — E785 Hyperlipidemia, unspecified: Secondary | ICD-10-CM

## 2023-02-01 DIAGNOSIS — F32A Depression, unspecified: Secondary | ICD-10-CM

## 2023-02-01 DIAGNOSIS — I10 Essential (primary) hypertension: Secondary | ICD-10-CM

## 2023-02-07 NOTE — Progress Notes (Signed)
Patient ID: Carrie Mcpherson, female    DOB: Dec 01, 1960  MRN: [id number]  CC: Chronic Care Management   Subjective: Carrie Mcpherson is a 62 y.o. female who presents for chronic care management.   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 Reports the last time she took her blood pressure medications was 3 days ago. She is not checking blood pressure outside of office. She denies red flag symptoms such as but not limited to chest pain, shortness of breath, worst headache of life, nausea/vomiting.  - Reports she is not taking anxiety depression medication consistently due to the same as above. She declines referral to therapist. She denies thoughts of self-harm, suicidal ideations, homicidal ideations.  Patient Active Problem List   Diagnosis Date Noted   CKD (chronic kidney disease) 07/20/2021   DKA, type 1 07/15/2021   Type 1 diabetes mellitus with ketoacidosis without coma 10/26/2019   DKA (diabetic ketoacidoses) 10/07/2015   Essential hypertension 10/07/2015   GERD (gastroesophageal reflux disease) 10/07/2015   Depression 10/07/2015   AKI (acute kidney injury) 10/07/2015   Acute pharyngitis 10/07/2015   Tobacco abuse 10/07/2015     Current Outpatient Medications on File Prior to Visit  Medication Sig Dispense Refill   aspirin EC 81 MG tablet Take 81 mg by mouth daily.     doxycycline (VIBRAMYCIN) 100 MG capsule Take 1 capsule (100 mg total) by mouth 2 (two) times daily. 20 capsule 0   Insulin Glargine (BASAGLAR KWIKPEN) 100 UNIT/ML INJECT 30 UNITS SUBCUTANEOUSLY ONCE DAILY 15 mL 0   insulin lispro (HUMALOG) 100 UNIT/ML injection Inject 0.08 mLs (8 Units total) into the skin 3 (three) times daily with meals. 20 mL 0   Insulin Syringe-Needle U-100 31G X 5/16" 0.5 ML MISC USE TO INJECT Lantus 20 units daily and Humalog 8 units 3 TIMES DAILY with meals 100  each 1   Multiple Vitamins-Minerals (MULTIVITAMIN WITH MINERALS) tablet Take 1 tablet by mouth 3 (three) times a week.      ondansetron (ZOFRAN) 4 MG tablet Take 1 tablet (4 mg total) by mouth every 6 (six) hours as needed for nausea. 20 tablet 0   vitamin B-12 (CYANOCOBALAMIN) 500 MCG tablet Take 500 mcg by mouth daily.     No current facility-administered medications on file prior to visit.    Allergies  Allergen Reactions   Bactrim [Sulfamethoxazole-Trimethoprim] Itching    Social History   Socioeconomic History   Marital status: Single    Spouse name: Not on file   Number of children: Not on file   Years of education: Not on file   Highest education level: Not on file  Occupational History   Not on file  Tobacco Use   Smoking status: Every Day    Packs/day: 0.25    Years: 30.00    Additional pack years: 0.00    Total pack years: 7.50    Types: Cigarettes    Passive exposure: Current   Smokeless tobacco: Never  Vaping Use   Vaping Use: Never used  Substance and Sexual Activity   Alcohol use: Yes    Comment: occasional   Drug use: No   Sexual activity: Not on file  Other Topics Concern   Not on file  Social History Narrative   Not on file   Social Determinants of Health   Financial Resource Strain: Not on file  Food Insecurity: Not on file  Transportation Needs: Not on file  Physical Activity: Not on file  Stress: Not on file  Social Connections: Not on file  Intimate Partner Violence: Not on file    Family History  Problem Relation Age of Onset   Hypertension Other    Breast cancer Maternal Aunt     Past Surgical History:  Procedure Laterality Date   COLONOSCOPY WITH PROPOFOL N/A 08/12/2014   Procedure: COLONOSCOPY WITH PROPOFOL;  Surgeon: Charolett Bumpers, MD;  Location: [facility];  Service: Endoscopy;  Laterality: N/A;   ROTATOR CUFF REPAIR Bilateral right 2004, left 1999    ROS: Review of Systems Negative except as stated  above  PHYSICAL EXAM: BP (!) 167/94 (BP Location: Right Arm, Patient Position: Sitting, Cuff Size: Normal)   Pulse 62   Temp 98.1 F (36.7 C)   Resp 14   Ht  (1.651 m)   Wt 175 lb 9.6 oz (79.7 kg)   SpO2 98%   BMI 29.22 kg/m   Physical Exam HENT:     Head: Normocephalic and atraumatic.  Eyes:     Extraocular Movements: Extraocular movements intact.     Conjunctiva/sclera: Conjunctivae normal.     Pupils: Pupils are equal, round, and reactive to light.  Cardiovascular:     Rate and Rhythm: Normal rate and regular rhythm.     Pulses: Normal pulses.     Heart sounds: Normal heart sounds.  Pulmonary:     Effort: Pulmonary effort is normal.     Breath sounds: Normal breath sounds.  Musculoskeletal:     Cervical back: Normal range of motion and neck supple.  Neurological:     General: No focal deficit present.     Mental Status: She is alert and oriented to person, place, and time.  Psychiatric:        Mood and Affect: Mood normal.        Behavior: Behavior normal.     ASSESSMENT AND PLAN: 1. Primary hypertension - Blood pressure not at goal during today's visit. Patient asymptomatic without chest pressure, chest pain, palpitations, shortness of breath, worst headache of life, and any additional red flag symptoms. - Patient reports she is not taking blood pressure medications consistently at home. - Resume Triamterene-Hydrochlorothiazide and Losartan as prescribed.  - Counseled on blood pressure goal of less than 130/80, low-sodium, DASH diet, medication compliance, and 150 minutes of moderate intensity exercise per week as tolerated. Counseled on medication adherence and adverse effects. - Follow-up with primary provider in 1 week or sooner if needed for blood pressure check.  - losartan (COZAAR) 100 MG tablet; Take 1 tablet (100 mg total) by mouth daily.  Dispense: 90 tablet; Refill: 0 - triamterene-hydrochlorothiazide (MAXZIDE-25) 37.5-25 MG tablet; Take 1 tablet by  mouth daily.  Dispense: 90 tablet; Refill: 0  2. Hyperlipidemia, unspecified hyperlipidemia type - Atorvastatin as prescribed.  - Follow-up with primary provider as scheduled. - atorvastatin (LIPITOR) 40 MG tablet; Take 1 tablet (40 mg total) by mouth every morning.  Dispense: 90 tablet; Refill: 0  3. Anxiety and depression - Patient denies thoughts of self-harm, suicidal ideations, homicidal ideations. - Continue Sertraline as prescribed.  - Patient declined referral to Psychiatry.  - Follow-up with primary provider as scheduled.  - sertraline (ZOLOFT) 100 MG tablet; Take 1 tablet (100 mg total) by mouth daily for 90 doses.  Dispense: 90 tablet; Refill: 0  4. Gastroesophageal reflux disease, unspecified whether esophagitis present - Continue Omeprazole as  prescribed.  - Follow-up with primary provider as scheduled.  - omeprazole (PRILOSEC) 40 MG capsule; Take 1 capsule (40 mg total) by mouth daily.  Dispense: 90 capsule; Refill: 0  5. Vitamin D deficiency - Routine screening.  - Vitamin D, 25-hydroxy    Patient was given the opportunity to ask questions.  Patient verbalized understanding of the plan and was able to repeat key elements of the plan. Patient was given clear instructions to go to Emergency Department or return to medical center if symptoms don't improve, worsen, or new problems develop.The patient verbalized understanding.   Orders Placed This Encounter  Procedures   Vitamin D, 25-hydroxy     Requested Prescriptions   Signed Prescriptions Disp Refills   sertraline (ZOLOFT) 100 MG tablet 90 tablet 0    Sig: Take 1 tablet (100 mg total) by mouth daily for 90 doses.   omeprazole (PRILOSEC) 40 MG capsule 90 capsule 0    Sig: Take 1 capsule (40 mg total) by mouth daily.   losartan (COZAAR) 100 MG tablet 90 tablet 0    Sig: Take 1 tablet (100 mg total) by mouth daily.   atorvastatin (LIPITOR) 40 MG tablet 90 tablet 0    Sig: Take 1 tablet (40 mg total) by mouth  every morning.   triamterene-hydrochlorothiazide (MAXZIDE-25) 37.5-25 MG tablet 90 tablet 0    Sig: Take 1 tablet by mouth daily.    Return in about 1 week (around 02/22/2023) for Follow-Up or next available bp check .  Carrie Fendt, NP

## 2023-02-15 ENCOUNTER — Encounter: Payer: Self-pay | Admitting: Family

## 2023-02-15 ENCOUNTER — Ambulatory Visit (INDEPENDENT_AMBULATORY_CARE_PROVIDER_SITE_OTHER): Payer: Commercial Managed Care - HMO | Admitting: Family

## 2023-02-15 VITALS — BP 167/94 | HR 62 | Temp 98.1°F | Resp 14 | Ht 65.0 in | Wt 175.6 lb

## 2023-02-15 DIAGNOSIS — E559 Vitamin D deficiency, unspecified: Secondary | ICD-10-CM

## 2023-02-15 DIAGNOSIS — I10 Essential (primary) hypertension: Secondary | ICD-10-CM

## 2023-02-15 DIAGNOSIS — E785 Hyperlipidemia, unspecified: Secondary | ICD-10-CM

## 2023-02-15 DIAGNOSIS — F419 Anxiety disorder, unspecified: Secondary | ICD-10-CM | POA: Diagnosis not present

## 2023-02-15 DIAGNOSIS — K219 Gastro-esophageal reflux disease without esophagitis: Secondary | ICD-10-CM | POA: Diagnosis not present

## 2023-02-15 DIAGNOSIS — F32A Depression, unspecified: Secondary | ICD-10-CM

## 2023-02-15 MED ORDER — OMEPRAZOLE 40 MG PO CPDR: 40.0000 mg | DELAYED_RELEASE_CAPSULE | Freq: Every day | ORAL | 0 refills | Status: DC

## 2023-02-15 MED ORDER — TRIAMTERENE-HCTZ 37.5-25 MG PO TABS
1.0000 | ORAL_TABLET | Freq: Every day | ORAL | 0 refills | Status: DC
Start: 2023-02-15 — End: 2023-09-01

## 2023-02-15 MED ORDER — ATORVASTATIN CALCIUM 40 MG PO TABS: 40.0000 mg | ORAL_TABLET | ORAL | 0 refills | Status: DC

## 2023-02-15 MED ORDER — SERTRALINE HCL 100 MG PO TABS: 100.0000 mg | ORAL_TABLET | Freq: Every day | ORAL | 0 refills | Status: DC

## 2023-02-15 MED ORDER — LOSARTAN POTASSIUM 100 MG PO TABS: 100.0000 mg | ORAL_TABLET | Freq: Every day | ORAL | 0 refills | Status: DC

## 2023-02-15 NOTE — Progress Notes (Deleted)
Patient ID: Carrie Mcpherson, female    DOB: January 26, 1961  MRN: [id number]  CC: Blood Pressure Check  Subjective: Carrie Mcpherson is a 62 y.o. female who presents for blood pressure check.   Her concerns today include:  HTN - Maxzide, Losartan  Patient Active Problem List   Diagnosis Date Noted   CKD (chronic kidney disease) 07/20/2021   DKA, type 1 07/15/2021   Type 1 diabetes mellitus with ketoacidosis without coma 10/26/2019   DKA (diabetic ketoacidoses) 10/07/2015   Essential hypertension 10/07/2015   GERD (gastroesophageal reflux disease) 10/07/2015   Depression 10/07/2015   AKI (acute kidney injury) 10/07/2015   Acute pharyngitis 10/07/2015   Tobacco abuse 10/07/2015     Current Outpatient Medications on File Prior to Visit  Medication Sig Dispense Refill   aspirin EC 81 MG tablet Take 81 mg by mouth daily.     atorvastatin (LIPITOR) 40 MG tablet Take 1 tablet (40 mg total) by mouth every morning. 90 tablet 0   doxycycline (VIBRAMYCIN) 100 MG capsule Take 1 capsule (100 mg total) by mouth 2 (two) times daily. 20 capsule 0   Insulin Glargine (BASAGLAR KWIKPEN) 100 UNIT/ML INJECT 30 UNITS SUBCUTANEOUSLY ONCE DAILY 15 mL 0   insulin lispro (HUMALOG) 100 UNIT/ML injection Inject 0.08 mLs (8 Units total) into the skin 3 (three) times daily with meals. 20 mL 0   Insulin Syringe-Needle U-100 31G X 5/16" 0.5 ML MISC USE TO INJECT Lantus 20 units daily and Humalog 8 units 3 TIMES DAILY with meals 100 each 1   losartan (COZAAR) 100 MG tablet Take 1 tablet (100 mg total) by mouth daily. 90 tablet 0   Multiple Vitamins-Minerals (MULTIVITAMIN WITH MINERALS) tablet Take 1 tablet by mouth 3 (three) times a week.      omeprazole (PRILOSEC) 40 MG capsule Take 1 capsule (40 mg total) by mouth daily. 90 capsule 0   ondansetron (ZOFRAN) 4 MG tablet Take 1 tablet (4 mg total) by mouth every 6 (six) hours as needed for nausea. 20 tablet 0   sertraline (ZOLOFT) 100 MG tablet Take 1 tablet (100 mg  total) by mouth daily for 90 doses. 90 tablet 0   triamterene-hydrochlorothiazide (MAXZIDE-25) 37.5-25 MG tablet Take 1 tablet by mouth daily. 90 tablet 0   vitamin B-12 (CYANOCOBALAMIN) 500 MCG tablet Take 500 mcg by mouth daily.     No current facility-administered medications on file prior to visit.    Allergies  Allergen Reactions   Bactrim [Sulfamethoxazole-Trimethoprim] Itching    Social History   Socioeconomic History   Marital status: Single    Spouse name: Not on file   Number of children: Not on file   Years of education: Not on file   Highest education level: Not on file  Occupational History   Not on file  Tobacco Use   Smoking status: Every Day    Packs/day: 0.25    Years: 30.00    Additional pack years: 0.00    Total pack years: 7.50    Types: Cigarettes    Passive exposure: Current   Smokeless tobacco: Never  Vaping Use   Vaping Use: Never used  Substance and Sexual Activity   Alcohol use: Yes    Comment: occasional   Drug use: No   Sexual activity: Not on file  Other Topics Concern   Not on file  Social History Narrative   Not on file   Social Determinants of Health   Financial Resource Strain: Not on  file  Food Insecurity: Not on file  Transportation Needs: Not on file  Physical Activity: Not on file  Stress: Not on file  Social Connections: Not on file  Intimate Partner Violence: Not on file    Family History  Problem Relation Age of Onset   Hypertension Other    Breast cancer Maternal Aunt     Past Surgical History:  Procedure Laterality Date   COLONOSCOPY WITH PROPOFOL N/A 08/12/2014   Procedure: COLONOSCOPY WITH PROPOFOL;  Surgeon: Charolett Bumpers, MD;  Location: [facility];  Service: Endoscopy;  Laterality: N/A;   ROTATOR CUFF REPAIR Bilateral right 2004, left 1999    ROS: Review of Systems Negative except as stated above  PHYSICAL EXAM: There were no vitals taken for this visit.  Physical Exam  {female adult  master:310786} {female adult master:310785}     Latest Ref Rng & Units 11/02/2022    9:30 AM 11/02/2022   12:00 AM 06/17/2022   10:19 AM  CMP  Glucose 70 - 99 mg/dL 31   161   BUN 8 - 27 mg/dL Creatinine 0.57 - 1.00 mg/dL 0.96  0.9     0.45   Sodium 134 - 144 mmol/L 140  140     139   Potassium 3.5 - 5.2 mmol/L 3.8  3.8     4.5   Chloride 96 - 106 mmol/L 101  101     101   CO2 20 - 29 mmol/L Calcium 8.7 - 10.3 mg/dL 9.3  9.3     9.4   Total Protein 6.0 - 8.5 g/dL   6.9   Total Bilirubin 0.0 - 1.2 mg/dL   0.4   Alkaline Phos 44 - 121 IU/L   101   AST 0 - 40 IU/L   12   ALT 0 - 32 IU/L   10      This result is from an external source.   Lipid Panel     Component Value Date/Time   CHOL 164 11/23/2021 1146   TRIG 79 11/23/2021 1146   HDL 63 11/23/2021 1146   CHOLHDL 2.6 11/23/2021 1146   CHOLHDL 3.2 Ratio 11/18/2010 2042   VLDL 23 11/18/2010 2042   LDLCALC 86 11/23/2021 1146    CBC    Component Value Date/Time   WBC 6.5 11/23/2021 1146   WBC 12.3 (H) 07/15/2021 0701   RBC 5.15 11/23/2021 1146   RBC 3.93 07/15/2021 0701   HGB 14.3 11/23/2021 1146   HCT 44.8 11/23/2021 1146   PLT 228 11/23/2021 1146   MCV 87 11/23/2021 1146   MCH 27.8 11/23/2021 1146   MCH 29.0 07/15/2021 0701   MCHC 31.9 11/23/2021 1146   MCHC 34.8 07/15/2021 0701   RDW 13.1 11/23/2021 1146   LYMPHSABS 0.8 10/07/2015 0500   MONOABS 0.3 10/07/2015 0500   EOSABS 0.0 10/07/2015 0500   BASOSABS 0.0 10/07/2015 0500    ASSESSMENT AND PLAN:  There are no diagnoses linked to this encounter.   Patient was given the opportunity to ask questions.  Patient verbalized understanding of the plan and was able to repeat key elements of the plan. Patient was given clear instructions to go to Emergency Department or return to medical center if symptoms don't improve, worsen, or new problems develop.The patient verbalized understanding.   No orders of the defined types were placed  in this encounter.  Requested Prescriptions    No prescriptions requested or ordered in this encounter    No follow-ups on file.  Camillia Herter, NP

## 2023-02-15 NOTE — Progress Notes (Signed)
Pt is here for chronic care mgt    

## 2023-02-16 ENCOUNTER — Other Ambulatory Visit: Payer: Self-pay | Admitting: Family

## 2023-02-16 DIAGNOSIS — E1010 Type 1 diabetes mellitus with ketoacidosis without coma: Secondary | ICD-10-CM

## 2023-02-16 LAB — VITAMIN D 25 HYDROXY (VIT D DEFICIENCY, FRACTURES): Vit D, 25-Hydroxy: 21.8 ng/mL — ABNORMAL LOW (ref 30.0–100.0)

## 2023-02-20 ENCOUNTER — Other Ambulatory Visit: Payer: Self-pay | Admitting: Family Medicine

## 2023-02-20 ENCOUNTER — Encounter: Payer: Self-pay | Admitting: Family

## 2023-02-20 MED ORDER — VITAMIN D (ERGOCALCIFEROL) 1.25 MG (50000 UNIT) PO CAPS: 50000.0000 [IU] | ORAL_CAPSULE | ORAL | 0 refills | Status: DC

## 2023-02-24 ENCOUNTER — Ambulatory Visit: Payer: Commercial Managed Care - HMO | Admitting: Family

## 2023-02-24 NOTE — Progress Notes (Unsigned)
Patient ID: [name], female    DOB: 11-12-1960  MRN: [id number]  CC: Blood Pressure Check  Subjective: Carrie Mcpherson is a 62 y.o. female who presents for blood pressure check.   Her concerns today include:  HTN - Maxzide, Losartan  Patient Active Problem List   Diagnosis Date Noted   CKD (chronic kidney disease) 07/20/2021   DKA, type 1 (HCC) 07/15/2021   Type 1 diabetes mellitus with ketoacidosis without coma (HCC) 10/26/2019   DKA (diabetic ketoacidoses) 10/07/2015   Essential hypertension 10/07/2015   GERD (gastroesophageal reflux disease) 10/07/2015   Depression 10/07/2015   AKI (acute kidney injury) (HCC) 10/07/2015   Acute pharyngitis 10/07/2015   Tobacco abuse 10/07/2015     Current Outpatient Medications on File Prior to Visit  Medication Sig Dispense Refill   aspirin EC 81 MG tablet Take 81 mg by mouth daily.     atorvastatin (LIPITOR) 40 MG tablet Take 1 tablet (40 mg total) by mouth every morning. 90 tablet 0   doxycycline (VIBRAMYCIN) 100 MG capsule Take 1 capsule (100 mg total) by mouth 2 (two) times daily. 20 capsule 0   Insulin Glargine (BASAGLAR KWIKPEN) 100 UNIT/ML INJECT 30 UNITS SUBCUTANEOUSLY ONCE DAILY 15 mL 0   insulin lispro (HUMALOG) 100 UNIT/ML injection Inject 0.08 mLs (8 Units total) into the skin 3 (three) times daily with meals. 20 mL 0   Insulin Syringe-Needle U-100 31G X 5/16" 0.5 ML MISC USE TO INJECT Lantus 20 units daily and Humalog 8 units 3 TIMES DAILY with meals 100 each 1   losartan (COZAAR) 100 MG tablet Take 1 tablet (100 mg total) by mouth daily. 90 tablet 0   Multiple Vitamins-Minerals (MULTIVITAMIN WITH MINERALS) tablet Take 1 tablet by mouth 3 (three) times a week.      omeprazole (PRILOSEC) 40 MG capsule Take 1 capsule (40 mg total) by mouth daily. 90 capsule 0   ondansetron (ZOFRAN) 4 MG tablet Take 1 tablet (4 mg total) by mouth every 6 (six) hours as needed for nausea. 20 tablet 0   sertraline (ZOLOFT) 100 MG tablet Take 1  tablet (100 mg total) by mouth daily for 90 doses. 90 tablet 0   triamterene-hydrochlorothiazide (MAXZIDE-25) 37.5-25 MG tablet Take 1 tablet by mouth daily. 90 tablet 0   vitamin B-12 (CYANOCOBALAMIN) 500 MCG tablet Take 500 mcg by mouth daily.     Vitamin D, Ergocalciferol, (DRISDOL) 1.25 MG (50000 UNIT) CAPS capsule Take 1 capsule (50,000 Units total) by mouth every 7 (seven) days. 12 capsule 0   No current facility-administered medications on file prior to visit.    Allergies  Allergen Reactions   Bactrim [Sulfamethoxazole-Trimethoprim] Itching    Social History   Socioeconomic History   Marital status: Single    Spouse name: Not on file   Number of children: Not on file   Years of education: Not on file   Highest education level: Not on file  Occupational History   Not on file  Tobacco Use   Smoking status: Every Day    Packs/day: 0.25    Years: 30.00    Additional pack years: 0.00    Total pack years: 7.50    Types: Cigarettes    Passive exposure: Current   Smokeless tobacco: Never  Vaping Use   Vaping Use: Never used  Substance and Sexual Activity   Alcohol use: Yes    Comment: occasional   Drug use: No   Sexual activity: Not on file  Other Topics Concern   Not on file  Social History Narrative   Not on file   Social Determinants of Health   Financial Resource Strain: Not on file  Food Insecurity: Not on file  Transportation Needs: Not on file  Physical Activity: Not on file  Stress: Not on file  Social Connections: Not on file  Intimate Partner Violence: Not on file    Family History  Problem Relation Age of Onset   Hypertension Other    Breast cancer Maternal Aunt     Past Surgical History:  Procedure Laterality Date   COLONOSCOPY WITH PROPOFOL N/A 08/12/2014   Procedure: COLONOSCOPY WITH PROPOFOL;  Surgeon: Charolett Bumpers, MD;  Location: [facility];  Service: Endoscopy;  Laterality: N/A;   ROTATOR CUFF REPAIR Bilateral right 2004, left  1999    ROS: Review of Systems Negative except as stated above  PHYSICAL EXAM: There were no vitals taken for this visit.  Physical Exam  {female adult master:310786} {female adult master:310785}     Latest Ref Rng & Units 11/02/2022    9:30 AM 11/02/2022   12:00 AM 06/17/2022   10:19 AM  CMP  Glucose 70 - 99 mg/dL 31   161   BUN 8 - 27 mg/dL 20  20     19    Creatinine 0.57 - 1.00 mg/dL 0.96  0.9     0.45   Sodium 134 - 144 mmol/L 140  140     139   Potassium 3.5 - 5.2 mmol/L 3.8  3.8     4.5   Chloride 96 - 106 mmol/L 101  101     101   CO2 20 - 29 mmol/L 22  22     22    Calcium 8.7 - 10.3 mg/dL 9.3  9.3     9.4   Total Protein 6.0 - 8.5 g/dL   6.9   Total Bilirubin 0.0 - 1.2 mg/dL   0.4   Alkaline Phos 44 - 121 IU/L   101   AST 0 - 40 IU/L   12   ALT 0 - 32 IU/L   10      This result is from an external source.    Lipid Panel     Component Value Date/Time   CHOL 164 11/23/2021 1146   TRIG 79 11/23/2021 1146   HDL 63 11/23/2021 1146   CHOLHDL 2.6 11/23/2021 1146   CHOLHDL 3.2 Ratio 11/18/2010 2042   VLDL 23 11/18/2010 2042   LDLCALC 86 11/23/2021 1146    CBC    Component Value Date/Time   WBC 6.5 11/23/2021 1146   WBC 12.3 (H) 07/15/2021 0701   RBC 5.15 11/23/2021 1146   RBC 3.93 07/15/2021 0701   HGB 14.3 11/23/2021 1146   HCT 44.8 11/23/2021 1146   PLT 228 11/23/2021 1146   MCV 87 11/23/2021 1146   MCH 27.8 11/23/2021 1146   MCH 29.0 07/15/2021 0701   MCHC 31.9 11/23/2021 1146   MCHC 34.8 07/15/2021 0701   RDW 13.1 11/23/2021 1146   LYMPHSABS 0.8 10/07/2015 0500   MONOABS 0.3 10/07/2015 0500   EOSABS 0.0 10/07/2015 0500   BASOSABS 0.0 10/07/2015 0500    ASSESSMENT AND PLAN:  There are no diagnoses linked to this encounter.   Patient was given the opportunity to ask questions.  Patient verbalized understanding of the plan and was able to repeat key elements of the plan. Patient was given clear instructions to go to Emergency Department  or  return to medical center if symptoms don't improve, worsen, or new problems develop.The patient verbalized understanding.   No orders of the defined types were placed in this encounter.    Requested Prescriptions    No prescriptions requested or ordered in this encounter    No follow-ups on file.  Rema Fendt, NP

## 2023-02-28 ENCOUNTER — Encounter: Payer: Self-pay | Admitting: Family

## 2023-02-28 ENCOUNTER — Ambulatory Visit (INDEPENDENT_AMBULATORY_CARE_PROVIDER_SITE_OTHER): Payer: Commercial Managed Care - HMO | Admitting: Family

## 2023-02-28 VITALS — BP 136/85 | HR 70 | Temp 98.5°F | Resp 16 | Wt 175.0 lb

## 2023-02-28 DIAGNOSIS — I10 Essential (primary) hypertension: Secondary | ICD-10-CM

## 2023-02-28 NOTE — Progress Notes (Signed)
F/u HTN  MRSA- Not taking doxycycline but would like a refill for the intermittent blisters that appears on her nose.  She also has an ointment for this

## 2023-04-09 ENCOUNTER — Other Ambulatory Visit: Payer: Self-pay | Admitting: Family

## 2023-04-09 DIAGNOSIS — E1010 Type 1 diabetes mellitus with ketoacidosis without coma: Secondary | ICD-10-CM

## 2023-04-10 NOTE — Telephone Encounter (Signed)
Requested Prescriptions  Pending Prescriptions Disp Refills   Insulin Glargine (BASAGLAR KWIKPEN) 100 UNIT/ML [Pharmacy Med Name: Basaglar KwikPen 100 UNIT/ML Subcutaneous Solution Pen-injector] 15 mL 0    Sig: INJECT 30 UNITS SUBCUTANEOUSLY ONCE DAILY     Endocrinology:  Diabetes - Insulins Passed - 04/09/2023 10:56 AM      Passed - HBA1C is between 0 and 7.9 and within 180 days    Hemoglobin A1C  Date Value Ref Range Status  11/02/2022 7.7 (A) 4.0 - 5.6 % Final   Hgb A1c MFr Bld  Date Value Ref Range Status  07/15/2021 8.6 (H) 4.8 - 5.6 % Final    Comment:    (NOTE)         Prediabetes: 5.7 - 6.4         Diabetes: >6.4         Glycemic control for adults with diabetes: <7.0          Passed - Valid encounter within last 6 months    Recent Outpatient Visits           1 month ago Primary hypertension   [place] [facility], [place], [place]   1 month ago Primary hypertension   [place] [facility], Amy J, NP   5 months ago Primary hypertension   [place] [facility], Amy J, NP   9 months ago Essential (primary) hypertension   [place] [facility], [place], [place]   1 year ago Essential (primary) hypertension   [place] [facility], [place], [place]       Future Appointments             In 4 months Rema Fendt, NP [facility]

## 2023-05-16 ENCOUNTER — Other Ambulatory Visit: Payer: Self-pay | Admitting: Family Medicine

## 2023-05-24 ENCOUNTER — Other Ambulatory Visit: Payer: Self-pay | Admitting: Family

## 2023-05-24 DIAGNOSIS — E1010 Type 1 diabetes mellitus with ketoacidosis without coma: Secondary | ICD-10-CM

## 2023-05-25 NOTE — Telephone Encounter (Signed)
Requested Prescriptions  Pending Prescriptions Disp Refills   Insulin Glargine (BASAGLAR KWIKPEN) 100 UNIT/ML [Pharmacy Med Name: Basaglar KwikPen 100 UNIT/ML Subcutaneous Solution Pen-injector] 15 mL 0    Sig: INJECT 30 UNITS SUBCUTANEOUSLY ONCE DAILY     Endocrinology:  Diabetes - Insulins Failed - 05/24/2023  9:29 AM      Failed - HBA1C is between 0 and 7.9 and within 180 days    Hemoglobin A1C  Date Value Ref Range Status  11/02/2022 7.7 (A) 4.0 - 5.6 % Final   Hgb A1c MFr Bld  Date Value Ref Range Status  07/15/2021 8.6 (H) 4.8 - 5.6 % Final    Comment:    (NOTE)         Prediabetes: 5.7 - 6.4         Diabetes: >6.4         Glycemic control for adults with diabetes: <7.0          Passed - Valid encounter within last 6 months    Recent Outpatient Visits           2 months ago Primary hypertension   [place] [facility], [place], [place]   3 months ago Primary hypertension   [place] [facility], Amy J, NP   6 months ago Primary hypertension   [place] [facility], Amy J, NP   11 months ago Essential (primary) hypertension   [place] [facility], [place], [place]   1 year ago Essential (primary) hypertension   [place] [facility], [place], [place]       Future Appointments             In 3 months Rema Fendt, NP [facility] at [facility]

## 2023-06-10 ENCOUNTER — Other Ambulatory Visit: Payer: Self-pay | Admitting: Family

## 2023-06-10 DIAGNOSIS — E1010 Type 1 diabetes mellitus with ketoacidosis without coma: Secondary | ICD-10-CM

## 2023-06-12 ENCOUNTER — Other Ambulatory Visit: Payer: Self-pay

## 2023-06-12 DIAGNOSIS — E1010 Type 1 diabetes mellitus with ketoacidosis without coma: Secondary | ICD-10-CM

## 2023-06-12 MED ORDER — INSULIN LISPRO 100 UNIT/ML IJ SOLN
8.0000 [IU] | Freq: Three times a day (TID) | INTRAMUSCULAR | 0 refills | Status: DC
Start: 2023-06-12 — End: 2023-10-02

## 2023-06-12 NOTE — Telephone Encounter (Signed)
Rx refill sent in @10 :26am 06/12/2023

## 2023-07-12 ENCOUNTER — Telehealth: Payer: Self-pay | Admitting: *Deleted

## 2023-07-12 ENCOUNTER — Other Ambulatory Visit: Payer: Self-pay | Admitting: Family

## 2023-07-12 DIAGNOSIS — E1010 Type 1 diabetes mellitus with ketoacidosis without coma: Secondary | ICD-10-CM

## 2023-07-12 NOTE — Telephone Encounter (Signed)
refills

## 2023-08-26 ENCOUNTER — Other Ambulatory Visit: Payer: Self-pay | Admitting: Family Medicine

## 2023-08-26 DIAGNOSIS — E1010 Type 1 diabetes mellitus with ketoacidosis without coma: Secondary | ICD-10-CM

## 2023-08-28 NOTE — Telephone Encounter (Signed)
Left voicemail to call back office and schedule a appointment.

## 2023-08-30 ENCOUNTER — Other Ambulatory Visit: Payer: Self-pay | Admitting: Family

## 2023-08-30 DIAGNOSIS — E1010 Type 1 diabetes mellitus with ketoacidosis without coma: Secondary | ICD-10-CM

## 2023-08-30 NOTE — Telephone Encounter (Signed)
Call patient with update. Confirm if patient still established with Endocrinology. Request refills from the same. Please let me know if I can further assist. Thank you.

## 2023-08-31 NOTE — Telephone Encounter (Signed)
Call patient with update. Confirm if patient still established with Endocrinology. Request refills from the same. Please let me know if I can further assist. Thank you.

## 2023-09-01 ENCOUNTER — Encounter: Payer: Self-pay | Admitting: Family

## 2023-09-01 ENCOUNTER — Ambulatory Visit (INDEPENDENT_AMBULATORY_CARE_PROVIDER_SITE_OTHER): Payer: Managed Care, Other (non HMO) | Admitting: Family

## 2023-09-01 VITALS — BP 138/85 | HR 55 | Temp 98.4°F | Ht 65.75 in | Wt 167.0 lb

## 2023-09-01 DIAGNOSIS — E1065 Type 1 diabetes mellitus with hyperglycemia: Secondary | ICD-10-CM

## 2023-09-01 DIAGNOSIS — I10 Essential (primary) hypertension: Secondary | ICD-10-CM | POA: Diagnosis not present

## 2023-09-01 DIAGNOSIS — E785 Hyperlipidemia, unspecified: Secondary | ICD-10-CM

## 2023-09-01 DIAGNOSIS — K219 Gastro-esophageal reflux disease without esophagitis: Secondary | ICD-10-CM

## 2023-09-01 DIAGNOSIS — F419 Anxiety disorder, unspecified: Secondary | ICD-10-CM

## 2023-09-01 DIAGNOSIS — Z23 Encounter for immunization: Secondary | ICD-10-CM

## 2023-09-01 DIAGNOSIS — E119 Type 2 diabetes mellitus without complications: Secondary | ICD-10-CM

## 2023-09-01 DIAGNOSIS — Z7984 Long term (current) use of oral hypoglycemic drugs: Secondary | ICD-10-CM

## 2023-09-01 DIAGNOSIS — F32A Depression, unspecified: Secondary | ICD-10-CM

## 2023-09-01 LAB — POCT GLYCOSYLATED HEMOGLOBIN (HGB A1C): HbA1c, POC (controlled diabetic range): 10 % — AB (ref 0.0–7.0)

## 2023-09-01 MED ORDER — TRIAMTERENE-HCTZ 37.5-25 MG PO TABS: 1.0000 | ORAL_TABLET | Freq: Every day | ORAL | 0 refills | Status: DC

## 2023-09-01 MED ORDER — BASAGLAR KWIKPEN 100 UNIT/ML ~~LOC~~ SOPN: PEN_INJECTOR | SUBCUTANEOUS | 2 refills | Status: DC

## 2023-09-01 MED ORDER — SERTRALINE HCL 100 MG PO TABS
100.0000 mg | ORAL_TABLET | Freq: Every day | ORAL | 0 refills | Status: DC
Start: 2023-09-01 — End: 2023-12-26

## 2023-09-01 MED ORDER — LOSARTAN POTASSIUM 100 MG PO TABS: 100.0000 mg | ORAL_TABLET | Freq: Every day | ORAL | 0 refills | Status: DC

## 2023-09-01 MED ORDER — ATORVASTATIN CALCIUM 40 MG PO TABS: 40.0000 mg | ORAL_TABLET | ORAL | 0 refills | Status: DC

## 2023-09-01 MED ORDER — OMEPRAZOLE 40 MG PO CPDR: 40.0000 mg | DELAYED_RELEASE_CAPSULE | Freq: Every day | ORAL | 0 refills | Status: DC

## 2023-09-01 NOTE — Progress Notes (Signed)
Patient ID: Carrie Mcpherson, female    DOB: [birth year]  MRN: [id number]  CC: Chronic Conditions Follow-Up  Subjective: Carrie Mcpherson is a 62 y.o. female who presents for chronic conditions follow-up.   Her concerns today include:  - Doing well on Triamterene-Hydrochlorothiazide and Losartan, no issues/concerns. Home blood pressures at goal. She does not complain of red flag symptoms such as but not limited to chest pain, shortness of breath, worst headache of life, nausea/vomiting.  - Doing well on Insulin Glargine (33 units daily) and Insulin Lispro (5 units as needed with meals), no issues/concerns. Reports she received a letter from her health insurance that Insulin Lispro will no longer be covered. She states the letter indicated which alternatives will be covered. Patient states she left the letter at home but will call back later to give medication name. Reports she did not establish with Endocrinology due to their office did not have any available appointments soon. States she plans to call them back soon to try and schedule an appointment. States blood sugars "depends" at home. Reports she is established with Ophthalmology. She denies red flag symptoms associated with diabetes.  - Doing well on Atorvastatin, no issues/concerns.  - Doing well on Sertraline, no issues/concerns. She denies thoughts of self-harm, suicidal ideations, homicidal ideations. - Doing well on Omeprazole, no issues/concerns.  - Established with Nephrology.   Patient Active Problem List   Diagnosis Date Noted   CKD (chronic kidney disease) 07/20/2021   DKA, type 1 (HCC) 07/15/2021   Type 1 diabetes mellitus with ketoacidosis without coma (HCC) 10/26/2019   DKA (diabetic ketoacidosis) (HCC) 10/07/2015   Essential hypertension 10/07/2015   GERD (gastroesophageal reflux disease) 10/07/2015   Depression 10/07/2015   AKI (acute kidney injury) (HCC) 10/07/2015   Acute pharyngitis 10/07/2015   Tobacco abuse  10/07/2015     Current Outpatient Medications on File Prior to Visit  Medication Sig Dispense Refill   aspirin EC 81 MG tablet Take 81 mg by mouth daily.     HUMALOG 100 UNIT/ML injection INJECT 8 UNITS SUBCUTANEOUSLY THREE TIMES DAILY WITH MEALS 20 mL 0   insulin lispro (HUMALOG) 100 UNIT/ML injection Inject 0.08 mLs (8 Units total) into the skin 3 (three) times daily with meals. 20 mL 0   Insulin Syringe-Needle U-100 31G X 5/16" 0.5 ML MISC USE TO INJECT Lantus 20 units daily and Humalog 8 units 3 TIMES DAILY with meals 100 each 1   Multiple Vitamins-Minerals (MULTIVITAMIN WITH MINERALS) tablet Take 1 tablet by mouth 3 (three) times a week.      ondansetron (ZOFRAN) 4 MG tablet Take 1 tablet (4 mg total) by mouth every 6 (six) hours as needed for nausea. 20 tablet 0   vitamin B-12 (CYANOCOBALAMIN) 500 MCG tablet Take 500 mcg by mouth daily.     Vitamin D, Ergocalciferol, (DRISDOL) 1.25 MG (50000 UNIT) CAPS capsule Take 1 capsule by mouth once a week 12 capsule 0   doxycycline (VIBRAMYCIN) 100 MG capsule Take 1 capsule (100 mg total) by mouth 2 (two) times daily. (Patient not taking: Reported on 09/01/2023) 20 capsule 0   No current facility-administered medications on file prior to visit.    Allergies  Allergen Reactions   Bactrim [Sulfamethoxazole-Trimethoprim] Itching    Social History   Socioeconomic History   Marital status: Single    Spouse name: Not on file   Number of children: Not on file   Years of education: Not on file   Highest education level:  Not on file  Occupational History   Not on file  Tobacco Use   Smoking status: Every Day    Current packs/day: 0.25    Average packs/day: 0.3 packs/day for 30.0 years (7.5 ttl pk-yrs)    Types: Cigarettes    Passive exposure: Current   Smokeless tobacco: Never  Vaping Use   Vaping status: Never Used  Substance and Sexual Activity   Alcohol use: Yes    Comment: occasional   Drug use: No   Sexual activity: Not on file   Other Topics Concern   Not on file  Social History Narrative   Not on file   Social Determinants of Health   Financial Resource Strain: Not on file  Food Insecurity: Not on file  Transportation Needs: Not on file  Physical Activity: Not on file  Stress: Not on file  Social Connections: Not on file  Intimate Partner Violence: Not on file    Family History  Problem Relation Age of Onset   Hypertension Other    Breast cancer Maternal Aunt     Past Surgical History:  Procedure Laterality Date   COLONOSCOPY WITH PROPOFOL N/A 08/12/2014   Procedure: COLONOSCOPY WITH PROPOFOL;  Surgeon: Charolett Bumpers, MD;  Location: [facility];  Service: Endoscopy;  Laterality: N/A;   ROTATOR CUFF REPAIR Bilateral right 2004, left 1999    ROS: Review of Systems Negative except as stated above  PHYSICAL EXAM: BP 138/85   Pulse (!) 55   Temp 98.4 F (36.9 C) (Oral)   Ht 5' 5.75" (1.67 m)   Wt 167 lb (75.8 kg)   SpO2 98%   BMI 27.16 kg/m   Physical Exam HENT:     Head: Normocephalic and atraumatic.     Nose: Nose normal.     Mouth/Throat:     Mouth: Mucous membranes are moist.     Pharynx: Oropharynx is clear.  Eyes:     Extraocular Movements: Extraocular movements intact.     Conjunctiva/sclera: Conjunctivae normal.     Pupils: Pupils are equal, round, and reactive to light.  Cardiovascular:     Rate and Rhythm: Bradycardia present.     Pulses: Normal pulses.     Heart sounds: Normal heart sounds.  Pulmonary:     Effort: Pulmonary effort is normal.     Breath sounds: Normal breath sounds.  Musculoskeletal:        General: Normal range of motion.     Cervical back: Normal range of motion and neck supple.  Neurological:     General: No focal deficit present.     Mental Status: She is alert and oriented to person, place, and time.  Psychiatric:        Mood and Affect: Mood normal.        Behavior: Behavior normal.    ASSESSMENT AND PLAN: 1. Primary hypertension -  Continue Triamterene-Hydrochlorothiazide and Losartan as prescribed.  - Counseled on blood pressure goal of less than 130/80, low-sodium, DASH diet, medication compliance, and 150 minutes of moderate intensity exercise per week as tolerated. Counseled on medication adherence and adverse effects. - Follow-up with primary provider in 3 months or sooner if needed.  - losartan (COZAAR) 100 MG tablet; Take 1 tablet (100 mg total) by mouth daily.  Dispense: 90 tablet; Refill: 0 - triamterene-hydrochlorothiazide (MAXZIDE-25) 37.5-25 MG tablet; Take 1 tablet by mouth daily.  Dispense: 90 tablet; Refill: 0  2. Uncontrolled type 1 diabetes mellitus with hyperglycemia (HCC) - Hemoglobin A1c above  goal at 10.0%, goal 7%.  - Increase Insulin Glargine from 33 units daily to 35 units daily for 7 days and then 38 units daily continuing.  - Patient reports she received a letter from her health insurance that Insulin Lispro will no longer be covered. Patient states the letter indicated which alternatives will be covered. Patient states she left the letter at home but will call back later to give medication name.  - Routine screening.  - Discussed the importance of healthy eating habits, low-carbohydrate diet, low-sugar diet, regular aerobic exercise (at least 150 minutes a week as tolerated) and medication compliance to achieve or maintain control of diabetes. - Follow-up with clinical pharmacist in 4 weeks or sooner if needed for diabetes checkup. Write your home blood sugar results down each day and bring those results to your appointment along with your home glucose monitor. Medications may be revised at that time if needed. - Referral to Endocrinology for evaluation/management. During the interim follow-up with primary provider as scheduled until established with referral. - POCT glycosylated hemoglobin (Hb A1C) - Insulin Glargine (BASAGLAR KWIKPEN) 100 UNIT/ML; Inject 35 Units into the skin daily for 7 days, THEN  38 Units daily.  Dispense: 15 mL; Refill: 2 - Ambulatory referral to Endocrinology - Microalbumin / creatinine urine ratio  3. Encounter for diabetic foot exam [facility]) - Referral to Podiatry for evaluation/management.  - Ambulatory referral to Podiatry  4. Hyperlipidemia, unspecified hyperlipidemia type - Continue Atorvastatin as prescribed. Counseled on medication adherence/adverse effects.  - Routine screening.  - Follow-up with primary provider as scheduled.  - atorvastatin (LIPITOR) 40 MG tablet; Take 1 tablet (40 mg total) by mouth every morning.  Dispense: 90 tablet; Refill: 0 - Lipid panel  5. Gastroesophageal reflux disease, unspecified whether esophagitis present - Continue Omeprazole as prescribed. Counseled on medication adherence/adverse effects.  - Follow-up with primary provider as scheduled.  - omeprazole (PRILOSEC) 40 MG capsule; Take 1 capsule (40 mg total) by mouth daily.  Dispense: 90 capsule; Refill: 0  6. Anxiety and depression - Patient denies thoughts of self-harm, suicidal ideations, homicidal ideations. - Continue Sertraline as prescribed. Counseled on medication adherence/adverse effects. - Follow-up with primary provider in 3 months or sooner if needed.  - sertraline (ZOLOFT) 100 MG tablet; Take 1 tablet (100 mg total) by mouth daily for 90 doses.  Dispense: 90 tablet; Refill: 0  7. Encounter for immunization - Administered.  - Flu vaccine trivalent PF, 6mos and older(Flulaval,Afluria,Fluarix,Fluzone)   Patient was given the opportunity to ask questions.  Patient verbalized understanding of the plan and was able to repeat key elements of the plan. Patient was given clear instructions to go to Emergency Department or return to medical center if symptoms don't improve, worsen, or new problems develop.The patient verbalized understanding.   Orders Placed This Encounter  Procedures   Flu vaccine trivalent PF, 6mos and older(Flulaval,Afluria,Fluarix,Fluzone)    Lipid panel   Microalbumin / creatinine urine ratio   Ambulatory referral to Endocrinology   Ambulatory referral to Podiatry   POCT glycosylated hemoglobin (Hb A1C)     Requested Prescriptions   Signed Prescriptions Disp Refills   atorvastatin (LIPITOR) 40 MG tablet 90 tablet 0    Sig: Take 1 tablet (40 mg total) by mouth every morning.   Insulin Glargine (BASAGLAR KWIKPEN) 100 UNIT/ML 15 mL 2    Sig: Inject 35 Units into the skin daily for 7 days, THEN 38 Units daily.   losartan (COZAAR) 100 MG tablet 90 tablet 0  Sig: Take 1 tablet (100 mg total) by mouth daily.   triamterene-hydrochlorothiazide (MAXZIDE-25) 37.5-25 MG tablet 90 tablet 0    Sig: Take 1 tablet by mouth daily.   omeprazole (PRILOSEC) 40 MG capsule 90 capsule 0    Sig: Take 1 capsule (40 mg total) by mouth daily.   sertraline (ZOLOFT) 100 MG tablet 90 tablet 0    Sig: Take 1 tablet (100 mg total) by mouth daily for 90 doses.    Return in about 3 months (around 12/02/2023) for Follow-Up or next available chronic conditions and 4 weeks with Georgiana Shore Ausdall, RPH-CPP diabetes .  Rema Fendt, NP

## 2023-09-01 NOTE — Progress Notes (Signed)
ASPatient states she wants to speak to you about one of her prescriptions.   Patient wants Flu vaccine.

## 2023-09-02 LAB — LIPID PANEL
Chol/HDL Ratio: 3.1 ratio (ref 0.0–4.4)
Cholesterol, Total: 181 mg/dL (ref 100–199)
HDL: 59 mg/dL (ref >39)
LDL Chol Calc (NIH): 108 mg/dL — ABNORMAL HIGH (ref 0–99)
Triglycerides: 75 mg/dL (ref 0–149)
VLDL Cholesterol Cal: 14 mg/dL (ref 5–40)

## 2023-09-02 LAB — MICROALBUMIN / CREATININE URINE RATIO
Creatinine, Urine: 64.6 mg/dL
Microalb/Creat Ratio: 20 mg/g{creat} (ref 0–29)
Microalbumin, Urine: 12.8 ug/mL

## 2023-10-02 ENCOUNTER — Telehealth: Payer: Self-pay | Admitting: Pharmacist

## 2023-10-02 ENCOUNTER — Encounter: Payer: Self-pay | Admitting: Pharmacist

## 2023-10-02 ENCOUNTER — Ambulatory Visit: Payer: Commercial Managed Care - HMO | Attending: Family Medicine | Admitting: Pharmacist

## 2023-10-02 ENCOUNTER — Other Ambulatory Visit: Payer: Self-pay

## 2023-10-02 DIAGNOSIS — E1010 Type 1 diabetes mellitus with ketoacidosis without coma: Secondary | ICD-10-CM

## 2023-10-02 DIAGNOSIS — Z794 Long term (current) use of insulin: Secondary | ICD-10-CM | POA: Diagnosis not present

## 2023-10-02 MED ORDER — FREESTYLE LIBRE 3 PLUS SENSOR MISC
6 refills | Status: AC
  Filled 2023-10-02: qty 2, 30d supply, fill #0
  Filled 2023-11-02 (×2): qty 2, 30d supply, fill #1
  Filled 2023-11-29: qty 2, 30d supply, fill #2
  Filled 2023-12-25: qty 2, 30d supply, fill #3
  Filled 2024-01-22 – 2024-02-01 (×2): qty 2, 30d supply, fill #4
  Filled 2024-02-29 – 2024-06-05 (×3): qty 2, 30d supply, fill #5

## 2023-10-02 MED ORDER — INSULIN ASPART 100 UNIT/ML IJ SOLN: 8.0000 [IU] | Freq: Three times a day (TID) | INTRAMUSCULAR | 1 refills | Status: DC

## 2023-10-02 MED ORDER — FREESTYLE LIBRE 3 READER DEVI
0 refills | Status: DC
  Filled 2023-10-02: qty 1, 30d supply, fill #0

## 2023-10-02 NOTE — Telephone Encounter (Signed)
Hey friend,   Can we start a PA for [place]? Both the sensors and the receiver?

## 2023-10-02 NOTE — Progress Notes (Signed)
    S:     No chief complaint on file.  62 y.o. female who presents for diabetes evaluation, education, and management. Patient arrives in good spirits and presents without any assistance.   Patient was referred and last seen by Primary Care Provider, Ricky Stabs, on 09/01/2023. PMH is significant for T1DM w/ a hx of DKA, CKD w/ hx of AKI, HTN, GERD.   Patient reports Diabetes was diagnosed in 1999. She tells me she was initially diagnosed as type 2. She was treated with insulin starting in 2003. She tells me she's never been on an insulin pump but has used CGM. She lost her job at that time and insurance coverage along with it. She then lost access to CGM and has not used it sense. She also is experiencing some stress related to having to care for her mother.   Family/Social History:  -Fhx: HTN, breast cancer -Tobacco: current 0.3 PPD smoker  -Alcohol: none reported  Current diabetes medications include: Basaglar 35u daily, Humalog 8u TID before meals (using ~6 units with a higher carb meal)  Patient reports adherence to taking all medications as prescribed.   Insurance coverage: [employer]  Patient reports hypoglycemic events with the increased dose of Basaglar.   Patient denies nocturia (nighttime urination).  Patient denies neuropathy (nerve pain). Patient denies visual changes. Patient reports self foot exams.   Patient reported dietary habits: Eats 1-2 meals/day Breakfast: eats Lunch: skips Dinner: eats  Patient-reported exercise habits: limited d/t providing are for mom  O:  No GM with her, no CGM in place.   Lab Results  Component Value Date   HGBA1C 10.0 (A) 09/01/2023   There were no vitals filed for this visit.  Lipid Panel     Component Value Date/Time   CHOL 181 09/01/2023 0902   TRIG 75 09/01/2023 0902   HDL 59 09/01/2023 0902   CHOLHDL 3.1 09/01/2023 0902   CHOLHDL 3.2 Ratio 11/18/2010 2042   VLDL 23 11/18/2010 2042   LDLCALC 108 (H) 09/01/2023 0902     Clinical Atherosclerotic Cardiovascular Disease (ASCVD): No  The 10-year ASCVD risk score (Arnett DK, et al., 2019) is: 23.9%   Values used to calculate the score:     Age: 7 years     Sex: Female     Is Non-Hispanic African American: No     Diabetic: Yes     Tobacco smoker: Yes     Systolic Blood Pressure: 138 mmHg     Is BP treated: Yes     HDL Cholesterol: 59 mg/dL     Total Cholesterol: 181 mg/dL   Patient is participating in a Managed Medicaid Plan: No   A/P: Diabetes longstanding currently uncontrolled. Patient is able to verbalize appropriate hypoglycemia management plan. Medication adherence appears to be appropriate.  -Continued Humalog, but change to Novolog for insurance coverage.  -Decreased Basaglar to 30 units d/t hypoglycemia.  -CGM supplies sent to our pharmacy.  -Patient educated on purpose, proper use, and potential adverse effects of insulin.  -Extensively discussed pathophysiology of diabetes, recommended lifestyle interventions, dietary effects on blood sugar control.  -Counseled on s/sx of and management of hypoglycemia.  -Next A1c anticipated 11/2022.   Written patient instructions provided. Patient verbalized understanding of treatment plan.  Total time in face to face counseling 30 minutes.    Follow-up:  Pharmacist in 1 month.  Butch Penny, PharmD, Patsy Baltimore, CPP Clinical Pharmacist [facility] [phone]

## 2023-10-03 ENCOUNTER — Other Ambulatory Visit: Payer: Self-pay

## 2023-10-28 ENCOUNTER — Other Ambulatory Visit: Payer: Self-pay | Admitting: Family

## 2023-10-28 DIAGNOSIS — E1065 Type 1 diabetes mellitus with hyperglycemia: Secondary | ICD-10-CM

## 2023-10-30 NOTE — Telephone Encounter (Signed)
 Complete

## 2023-11-02 ENCOUNTER — Other Ambulatory Visit: Payer: Self-pay

## 2023-11-02 ENCOUNTER — Ambulatory Visit: Payer: Commercial Managed Care - HMO | Attending: Family | Admitting: Pharmacist

## 2023-11-02 DIAGNOSIS — Z794 Long term (current) use of insulin: Secondary | ICD-10-CM

## 2023-11-02 DIAGNOSIS — E1010 Type 1 diabetes mellitus with ketoacidosis without coma: Secondary | ICD-10-CM | POA: Diagnosis not present

## 2023-11-02 NOTE — Progress Notes (Signed)
 S:     No chief complaint on file.  63 y.o. female with PMHx significant for HTN, GERD, T1DM (with history of DKA), CKD, and depression who presents for diabetes evaluation, education, and management.   Patient was referred and last seen by Primary Care Provider, Greig Drones, on 09/01/2023. She was last seen by pharmacy on 10/02/2023. At that time she complained of hypoglycemic events with increased dose of Basaglar , so dose was decreased. Prescription for CGM was sent for pharmacy.   Patient reports Diabetes was diagnosed in 1999. Per patient she was intially diagnosed as T2DM. She was started on insulin  therapy in 2003.   Patient arrives in good spirits and presents without any assistance. Today she states over the last couple of weeks her blood sugars have stayed high. Her supply of Basaglar  had been dwindling since last week, but was too early to refill until last Sunday. Her preferred pharmacy was out of Basaglar  until yesterday evening. Prior to yesterday her last basal insulin  dose was 18 units taken on Sunday evening. She remained adherent to her meal time insulin . She states her BG this morning was 355 prior to breakfast.   BG in clinic today was 154 per CGM sensor. She reported two times in the last week when her BG fell <70. She was able to bring it up by eating a small snack. Average BG over last 7 days was 203.   Family/Social History:  -Family history: HTN, breast cancer -Tobacco: current 0.3 PPD smoker -Alcohol: not reported   Current diabetes medications include:  -insulin  aspart 8 units TID before meals  -Basaglar  30 units once daily   Patient reports poor adherence to basal insulin  due to difficulties obtaining supply.   Patient reports hypoglycemic events.Review of CGM data revealed 3 hypoglycemic events in the last 7 days. Two events occurred between 6 am to 12 pm. 1 occurred between 12 pm-6pm. At each time BG was 69 mg/dl.   Patient reports nocturia (nighttime  urination) and polyuria. However is not sure if this is due to her blood sugar or increased fluid intake (she drinks about 40 oz of water per day). Patient denies neuropathy (nerve pain). Patient denies visual changes. Patient reports self foot exams. No issues noted.   O:  Freestyle Libre 3 Plus CGM Data (last 7 days)  Average BG last 7 days: 203 Time in Range: 41% Time above range: 57% Time below Range: 2% Fasting BG today: 355   Lab Results  Component Value Date   HGBA1C 10.0 (A) 09/01/2023   There were no vitals filed for this visit.  Lipid Panel     Component Value Date/Time   CHOL 181 09/01/2023 0902   TRIG 75 09/01/2023 0902   HDL 59 09/01/2023 0902   CHOLHDL 3.1 09/01/2023 0902   CHOLHDL 3.2 Ratio 11/18/2010 2042   VLDL 23 11/18/2010 2042   LDLCALC 108 (H) 09/01/2023 0902    Clinical Atherosclerotic Cardiovascular Disease (ASCVD): No  The 10-year ASCVD risk score (Arnett DK, et al., 2019) is: 23.9%   Values used to calculate the score:     Age: 42 years     Sex: Female     Is Non-Hispanic African American: No     Diabetic: Yes     Tobacco smoker: Yes     Systolic Blood Pressure: 138 mmHg     Is BP treated: Yes     HDL Cholesterol: 59 mg/dL     Total Cholesterol: 181 mg/dL  Patient is participating in a Managed Medicaid Plan:  No   A/P: Diabetes longstanding currently uncontrolled. A1c not at goal (<7) at 10. Patient is able to verbalize appropriate hypoglycemia management plan. Medication adherence appears poor due to difficulties with basal insulin  supply at her pharmacy. She has remained adherent to bolus insulin  regimen.  -Continued basal insulin  Basaglar  (insulin  glargine) 30 units once daily  -Continued insulin  lispro up to 8 units TID with meals  -Extensively discussed pathophysiology of diabetes, recommended lifestyle interventions, dietary effects on blood sugar control.  -Spoke with patient to consider following up with endocrinology referral in  the future. 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  -Counseled on s/sx of and management of hypoglycemia.  -Next A1c anticipated February 2025.   Follow-up: Return to pharmacy clinic in 2 months for diabetes follow up.   Tolu Kamyrah Feeser, PharmD [facility] PGY-1

## 2023-11-04 ENCOUNTER — Encounter: Payer: Self-pay | Admitting: Pharmacist

## 2023-11-07 ENCOUNTER — Other Ambulatory Visit: Payer: Self-pay

## 2023-11-29 ENCOUNTER — Other Ambulatory Visit: Payer: Self-pay

## 2023-12-03 ENCOUNTER — Other Ambulatory Visit: Payer: Self-pay

## 2023-12-03 ENCOUNTER — Ambulatory Visit (INDEPENDENT_AMBULATORY_CARE_PROVIDER_SITE_OTHER): Payer: Commercial Managed Care - HMO

## 2023-12-03 ENCOUNTER — Ambulatory Visit
Admission: EM | Admit: 2023-12-03 | Discharge: 2023-12-03 | Disposition: A | Discharge status: Home / Self Care | Payer: Commercial Managed Care - HMO

## 2023-12-03 ENCOUNTER — Encounter: Payer: Self-pay | Admitting: *Deleted

## 2023-12-03 DIAGNOSIS — S82891A Other fracture of right lower leg, initial encounter for closed fracture: Secondary | ICD-10-CM

## 2023-12-03 MED ORDER — TRAMADOL HCL 50 MG PO TABS: 50.0000 mg | ORAL_TABLET | Freq: Four times a day (QID) | ORAL | 0 refills | Status: AC | PRN

## 2023-12-03 NOTE — ED Provider Notes (Signed)
 [name]    CSN: [id number] Arrival date & time: 12/03/23  1306      History   Chief Complaint Chief Complaint  Patient presents with   Ankle Pain    HPI Carrie Mcpherson is a 63 y.o. female.   Patient reports that she caught her shoe on the carpet and her ankle twisted.  Patient complains of swelling and pain.  Patient has had pain with attempting to ambulate.  Patient states that she took a tramadol  before she came in.  Patient has a past medical history of hypertension.  Patient states she did not strike her head she did not lose consciousness she complains of swelling in her right ankle.  The history is provided by the patient. No language interpreter was used.  Ankle Pain   Past Medical History:  Diagnosis Date   Blood clot in vein 1994    superficial after right ankle fracture   Depression    Diabetes mellitus without complication (HCC)    GERD (gastroesophageal reflux disease)    occasional   Hypertension    PONV (postoperative nausea and vomiting)    vomiting after one surgery x 1   Sciatic leg pain    right leg   Sleep apnea sept 2014   borderline, no cpap needed    Patient Active Problem List   Diagnosis Date Noted   CKD (chronic kidney disease) 07/20/2021   DKA, type 1 (HCC) 07/15/2021   Type 1 diabetes mellitus with ketoacidosis without coma (HCC) 10/26/2019   DKA (diabetic ketoacidosis) (HCC) 10/07/2015   Essential hypertension 10/07/2015   GERD (gastroesophageal reflux disease) 10/07/2015   Depression 10/07/2015   AKI (acute kidney injury) (HCC) 10/07/2015   Acute pharyngitis 10/07/2015   Tobacco abuse 10/07/2015    Past Surgical History:  Procedure Laterality Date   COLONOSCOPY WITH PROPOFOL  N/A 08/12/2014   Procedure: COLONOSCOPY WITH PROPOFOL ;  Surgeon: Gladis MARLA Louder, MD;  Location: [facility];  Service: Endoscopy;  Laterality: N/A;   ROTATOR CUFF REPAIR Bilateral right 2004, left 1999    OB History   No obstetric  history on file.      Home Medications    Prior to Admission medications   Medication Sig Start Date End Date Taking? Authorizing Provider  aspirin  EC 81 MG tablet Take 81 mg by mouth daily.   Yes [provider]  atorvastatin  (LIPITOR) 40 MG tablet Take 1 tablet (40 mg total) by mouth every morning. 09/01/23 12/03/23 Yes Lorren, Amy J, NP  HUMALOG  100 UNIT/ML injection SMARTSIG:8 Unit(s) SUB-Q 3 Times Daily 10/03/23  Yes [provider]  insulin  aspart (NOVOLOG ) 100 UNIT/ML injection Inject 8 Units into the skin 3 (three) times daily before meals. 10/02/23  Yes Newlin, Enobong, MD  Insulin  Glargine (BASAGLAR  KWIKPEN) 100 UNIT/ML INJECT 30 UNITS SUBCUTANEOUSLY ONCE DAILY . APPOINTMENT REQUIRED FOR FUTURE REFILLS 10/30/23  Yes Lorren, Amy J, NP  losartan  (COZAAR ) 100 MG tablet Take 1 tablet (100 mg total) by mouth daily. 09/01/23 12/03/23 Yes Lorren Greig PARAS, NP  Multiple Vitamins-Minerals (MULTIVITAMIN WITH MINERALS) tablet Take 1 tablet by mouth 3 (three) times a week.    Yes [provider]  omeprazole  (PRILOSEC) 40 MG capsule Take 1 capsule (40 mg total) by mouth daily. 09/01/23 12/03/23 Yes Lorren, Amy J, NP  ondansetron  (ZOFRAN ) 4 MG tablet Take 1 tablet (4 mg total) by mouth every 6 (six) hours as needed for nausea. 07/16/21  Yes Cheryle Page, MD  sertraline  (ZOLOFT ) 100  MG tablet Take 1 tablet (100 mg total) by mouth daily for 90 doses. 09/01/23 12/03/23 Yes Lorren, Amy J, NP  traMADol  (ULTRAM ) 50 MG tablet Take 1 tablet (50 mg total) by mouth every 6 (six) hours as needed. 12/03/23 12/02/24 Yes Lonie Rummell K, PA-C  triamterene -hydrochlorothiazide  (MAXZIDE -25) 37.5-25 MG tablet Take 1 tablet by mouth daily. 09/01/23 12/03/23 Yes Lorren, Amy J, NP  vitamin B-12 (CYANOCOBALAMIN) 500 MCG tablet Take 500 mcg by mouth daily.   Yes [provider]  Vitamin D , Ergocalciferol , (DRISDOL ) 1.25 MG (50000 UNIT) CAPS capsule Take 1 capsule by mouth once a week 05/16/23  Yes  Lorren, Amy J, NP  Continuous Glucose Receiver (FREESTYLE LIBRE 3 READER) DEVI Use to check blood sugar continuously throughout the day. Change sensor every 15 days. 10/02/23   Newlin, Enobong, MD  Continuous Glucose Sensor (FREESTYLE LIBRE 3 PLUS SENSOR) MISC Use to check blood sugar continuously throughout the day. Change sensor every 15 days. 10/02/23   Newlin, Enobong, MD  doxycycline  (VIBRAMYCIN ) 100 MG capsule Take 1 capsule (100 mg total) by mouth 2 (two) times daily. Patient not taking: Reported on 09/01/2023 06/10/22   Billy Asberry FALCON, PA-C  Insulin  Syringe-Needle U-100 31G X 5/16 0.5 ML MISC USE TO INJECT Lantus  20 units daily and Humalog  8 units 3 TIMES DAILY with meals 08/17/21   Lorren Greig PARAS, NP    Family History Family History  Problem Relation Age of Onset   Hypertension Other    Breast cancer Maternal Aunt     Social History Social History   Tobacco Use   Smoking status: Every Day    Current packs/day: 0.25    Average packs/day: 0.3 packs/day for 30.0 years (7.5 ttl pk-yrs)    Types: Cigarettes    Passive exposure: Current   Smokeless tobacco: Never  Vaping Use   Vaping status: Never Used  Substance Use Topics   Alcohol use: Yes    Comment: occasional   Drug use: No     Allergies   Bactrim  [sulfamethoxazole -trimethoprim ]   Review of Systems Review of Systems  All other systems reviewed and are negative.    Physical Exam Triage Vital Signs ED Triage Vitals [12/03/23 1534]  Encounter Vitals Group     BP (!) 189/101     Systolic BP Percentile      Diastolic BP Percentile      Pulse Rate 62     Resp 18     Temp 97.9 F (36.6 C)     Temp Source Oral     SpO2 96 %     Weight      Height      Head Circumference      Peak Flow      Pain Score      Pain Loc      Pain Education      Exclude from Growth Chart    No data found.  Updated Vital Signs BP (!) 189/101 (BP Location: Right Arm)   Pulse 62   Temp 97.9 F (36.6 C) (Oral)   Resp  18   SpO2 96%   Visual Acuity Right Eye Distance:   Left Eye Distance:   Bilateral Distance:    Right Eye Near:   Left Eye Near:    Bilateral Near:     Physical Exam Vitals reviewed.  Constitutional:      Appearance: Normal appearance.  Cardiovascular:     Rate and Rhythm: Normal rate.  Pulmonary:  Effort: Pulmonary effort is normal.  Musculoskeletal:        General: Swelling and tenderness present. Normal range of motion.  Skin:    General: Skin is warm.  Neurological:     General: No focal deficit present.     Mental Status: She is alert.      UC Treatments / Results  Labs (all labs ordered are listed, but only abnormal results are displayed) Labs Reviewed - No data to display  EKG   Radiology DG Ankle Complete Right Result Date: 12/03/2023 CLINICAL DATA:  Cuboid ankle pain and swelling after twisting injury. EXAM: RIGHT ANKLE - COMPLETE 3+ VIEW COMPARISON:  None Available. FINDINGS: Small avulsion fracture of the lateral talar process. No additional fracture. No dislocation. Joint spaces are preserved. Osteopenia. Lateral hindfoot soft tissue swelling. IMPRESSION: 1. Small avulsion fracture of the lateral talar process. Electronically Signed   By: Elsie ONEIDA Shoulder M.D.   On: 12/03/2023 16:21    Procedures Procedures (including critical care time)  Medications Ordered in UC Medications - No data to display  Initial Impression / Assessment and Plan / UC Course  I have reviewed the triage vital signs and the nursing notes.  Pertinent labs & imaging results that were available during my care of the patient were reviewed by me and considered in my medical decision making (see chart for details).     X-ray shows small avulsion fracture.  Patient counseled on results.  Patient is advised to follow-up with her orthopedist for recheck in 1 week.  Patient is given a prescription for tramadol  she is placed in a cam walker and given crutches. Final Clinical  Impressions(s) / UC Diagnoses   Final diagnoses:  Closed fracture of right ankle, initial encounter   Discharge Instructions   None    ED Prescriptions     Medication Sig Dispense Auth. Provider   traMADol  (ULTRAM ) 50 MG tablet Take 1 tablet (50 mg total) by mouth every 6 (six) hours as needed. 20 tablet Simmie Camerer K, PA-C      I have reviewed the PDMP during this encounter. An After Visit Summary was printed and given to the patient.       Flint Sonny POUR, PA-C 12/03/23 [id number]

## 2023-12-03 NOTE — ED Triage Notes (Addendum)
 States her feet got caught on her slippers this morning and she twisted her ankle and felt a pop. C/o pain and swelling in right ankle. States she has "fractured" this ankle twice. She took a tramadol  (from a family member) right before she came here

## 2023-12-11 ENCOUNTER — Other Ambulatory Visit: Payer: Self-pay

## 2023-12-11 ENCOUNTER — Other Ambulatory Visit: Payer: Self-pay | Admitting: Family Medicine

## 2023-12-11 DIAGNOSIS — E1010 Type 1 diabetes mellitus with ketoacidosis without coma: Secondary | ICD-10-CM

## 2023-12-12 ENCOUNTER — Encounter: Payer: Managed Care, Other (non HMO) | Admitting: Family

## 2023-12-12 ENCOUNTER — Other Ambulatory Visit: Payer: Self-pay | Admitting: Family

## 2023-12-12 ENCOUNTER — Other Ambulatory Visit: Payer: Self-pay

## 2023-12-12 DIAGNOSIS — E1065 Type 1 diabetes mellitus with hyperglycemia: Secondary | ICD-10-CM

## 2023-12-12 MED ORDER — FREESTYLE LIBRE 3 READER DEVI
0 refills | Status: DC
  Filled 2023-12-12: qty 1, 30d supply, fill #0

## 2023-12-12 NOTE — Progress Notes (Signed)
 Erroneous encounter-disregard

## 2023-12-13 NOTE — Telephone Encounter (Signed)
 Complete

## 2023-12-14 ENCOUNTER — Ambulatory Visit: Payer: Commercial Managed Care - HMO | Admitting: Orthopedic Surgery

## 2023-12-19 ENCOUNTER — Encounter: Payer: Self-pay | Admitting: Orthopedic Surgery

## 2023-12-19 ENCOUNTER — Ambulatory Visit: Payer: Commercial Managed Care - HMO | Admitting: Orthopedic Surgery

## 2023-12-19 ENCOUNTER — Other Ambulatory Visit (INDEPENDENT_AMBULATORY_CARE_PROVIDER_SITE_OTHER): Payer: Self-pay

## 2023-12-19 DIAGNOSIS — M25571 Pain in right ankle and joints of right foot: Secondary | ICD-10-CM | POA: Diagnosis not present

## 2023-12-19 MED ORDER — TRAMADOL HCL 50 MG PO TABS: 50.0000 mg | ORAL_TABLET | Freq: Four times a day (QID) | ORAL | 0 refills | Status: AC | PRN

## 2023-12-19 NOTE — Progress Notes (Signed)
 Office Visit Note   Patient: Carrie Mcpherson           Date of Birth: Jun 03, 1961           MRN: [id number] Visit Date: 12/19/2023              Requested by: Rema Fendt, NP [address] [place],  [place] [address] PCP: Rema Fendt, NP  Chief Complaint  Patient presents with   Right Ankle - Pain      HPI: Patient is a 63 year old woman who was walking in her bedroom slippers when her toes got caught and she fell backwards.  Patient did go to urgent care she is currently in a cam boot with a crutch.  Assessment & Plan: Visit Diagnoses:  1. Pain in right ankle and joints of right foot     Plan: Recommend she continue with a cam boot for 3 weeks.  At follow-up in 3 weeks obtain three-view radiographs of the right ankle.  Prescription for tramadol called in.  Follow-Up Instructions: Return in about 3 weeks (around 01/09/2024).   Ortho Exam  Patient is alert, oriented, no adenopathy, well-dressed, normal affect, normal respiratory effort. Examination patient has bruising in the toes as well as bruising in the calf.  Patient has a stable anterior drawer she is tender to palpation over the lateral ankle ligaments as well as over the deltoid.  Syndesmosis is not tender with lateral compression.  Imaging: XR Ankle Complete Right Result Date: 12/19/2023 Three-view radiographs of the right ankle shows a congruent mortise.  There is no widening of the syndesmosis no fractures of the distal tibia or fibula.  There is a small avulsion fracture off the insertion of the calcaneal fibular ligament.  No images are attached to the encounter.  Labs: Lab Results  Component Value Date   HGBA1C 10.0 (A) 09/01/2023   HGBA1C 7.7 (A) 11/02/2022   HGBA1C 7.4 (A) 11/23/2021     Lab Results  Component Value Date   ALBUMIN 4.4 06/17/2022   ALBUMIN 3.0 (L) 10/08/2015   ALBUMIN 4.5 10/07/2015    Lab Results  Component Value Date   MG 2.0 07/16/2021   Lab Results   Component Value Date   VD25OH 21.8 (L) 02/15/2023   VD25OH 30 12/10/2009   VD25OH 19 (L) 08/28/2009    No results found for: "PREALBUMIN"    Latest Ref Rng & Units 11/23/2021   11:46 AM 07/15/2021    7:01 AM 07/14/2021   10:50 PM  CBC EXTENDED  WBC 3.4 - 10.8 x10E3/uL 6.5  12.3  12.8   RBC 3.77 - 5.28 x10E6/uL 5.15  3.93  4.58   Hemoglobin 11.1 - 15.9 g/dL 19.1  47.8  29.5   HCT 34.0 - 46.6 % 44.8  32.8  40.3   Platelets 150 - 450 x10E3/uL 228  185  225      There is no height or weight on file to calculate BMI.  Orders:  Orders Placed This Encounter  Procedures   XR Ankle Complete Right   Meds ordered this encounter  Medications   traMADol (ULTRAM) 50 MG tablet    Sig: Take 1 tablet (50 mg total) by mouth every 6 (six) hours as needed.    Dispense:  30 tablet    Refill:  0     Procedures: No procedures performed  Clinical Data: No additional findings.  ROS:  All other systems negative, except as noted in the HPI.  Review of Systems  Objective: Vital Signs: There were no vitals taken for this visit.  Specialty Comments:  No specialty comments available.  PMFS History: Patient Active Problem List   Diagnosis Date Noted   CKD (chronic kidney disease) 07/20/2021   DKA, type 1 (HCC) 07/15/2021   Type 1 diabetes mellitus with ketoacidosis without coma (HCC) 10/26/2019   DKA (diabetic ketoacidosis) (HCC) 10/07/2015   Essential hypertension 10/07/2015   GERD (gastroesophageal reflux disease) 10/07/2015   Depression 10/07/2015   AKI (acute kidney injury) (HCC) 10/07/2015   Acute pharyngitis 10/07/2015   Tobacco abuse 10/07/2015   Past Medical History:  Diagnosis Date   Blood clot in vein 1994    superficial after right ankle fracture   Depression    Diabetes mellitus without complication (HCC)    GERD (gastroesophageal reflux disease)    occasional   Hypertension    PONV (postoperative nausea and vomiting)    vomiting after one surgery x 1   Sciatic  leg pain    right leg   Sleep apnea sept 2014   borderline, no cpap needed    Family History  Problem Relation Age of Onset   Hypertension Other    Breast cancer Maternal Aunt     Past Surgical History:  Procedure Laterality Date   COLONOSCOPY WITH PROPOFOL N/A 08/12/2014   Procedure: COLONOSCOPY WITH PROPOFOL;  Surgeon: Charolett Bumpers, MD;  Location: [facility];  Service: Endoscopy;  Laterality: N/A;   ROTATOR CUFF REPAIR Bilateral right 2004, left 1999   Social History   Occupational History   Not on file  Tobacco Use   Smoking status: Every Day    Current packs/day: 0.25    Average packs/day: 0.3 packs/day for 30.0 years (7.5 ttl pk-yrs)    Types: Cigarettes    Passive exposure: Current   Smokeless tobacco: Never  Vaping Use   Vaping status: Never Used  Substance and Sexual Activity   Alcohol use: Yes    Comment: occasional   Drug use: No   Sexual activity: Not on file

## 2023-12-25 ENCOUNTER — Other Ambulatory Visit: Payer: Self-pay

## 2023-12-26 ENCOUNTER — Encounter: Payer: Self-pay | Admitting: Family

## 2023-12-26 ENCOUNTER — Ambulatory Visit (INDEPENDENT_AMBULATORY_CARE_PROVIDER_SITE_OTHER): Payer: Managed Care, Other (non HMO) | Admitting: Family

## 2023-12-26 ENCOUNTER — Other Ambulatory Visit: Payer: Self-pay

## 2023-12-26 VITALS — BP 150/88 | HR 69 | Temp 98.7°F | Ht 66.5 in | Wt 164.6 lb

## 2023-12-26 DIAGNOSIS — E1065 Type 1 diabetes mellitus with hyperglycemia: Secondary | ICD-10-CM | POA: Diagnosis not present

## 2023-12-26 DIAGNOSIS — E785 Hyperlipidemia, unspecified: Secondary | ICD-10-CM

## 2023-12-26 DIAGNOSIS — F419 Anxiety disorder, unspecified: Secondary | ICD-10-CM

## 2023-12-26 DIAGNOSIS — Z794 Long term (current) use of insulin: Secondary | ICD-10-CM

## 2023-12-26 DIAGNOSIS — I10 Essential (primary) hypertension: Secondary | ICD-10-CM | POA: Diagnosis not present

## 2023-12-26 DIAGNOSIS — F32A Depression, unspecified: Secondary | ICD-10-CM

## 2023-12-26 DIAGNOSIS — K219 Gastro-esophageal reflux disease without esophagitis: Secondary | ICD-10-CM

## 2023-12-26 DIAGNOSIS — Z23 Encounter for immunization: Secondary | ICD-10-CM | POA: Diagnosis not present

## 2023-12-26 LAB — POCT GLYCOSYLATED HEMOGLOBIN (HGB A1C): HbA1c, POC (controlled diabetic range): 8.5 % — AB (ref 0.0–7.0)

## 2023-12-26 MED ORDER — SERTRALINE HCL 100 MG PO TABS: 100.0000 mg | ORAL_TABLET | Freq: Every day | ORAL | 0 refills | Status: DC

## 2023-12-26 MED ORDER — LOSARTAN POTASSIUM 100 MG PO TABS: 100.0000 mg | ORAL_TABLET | Freq: Every day | ORAL | 0 refills | Status: DC

## 2023-12-26 MED ORDER — OMEPRAZOLE 40 MG PO CPDR: 40.0000 mg | DELAYED_RELEASE_CAPSULE | Freq: Every day | ORAL | 0 refills | Status: DC

## 2023-12-26 MED ORDER — INSULIN ASPART 100 UNIT/ML IJ SOLN
8.0000 [IU] | Freq: Three times a day (TID) | INTRAMUSCULAR | 1 refills | Status: DC
Start: 2023-12-26 — End: 2024-03-25

## 2023-12-26 MED ORDER — BASAGLAR KWIKPEN 100 UNIT/ML ~~LOC~~ SOPN: 30.0000 [IU] | PEN_INJECTOR | Freq: Every day | SUBCUTANEOUS | 1 refills | Status: DC

## 2023-12-26 MED ORDER — ATORVASTATIN CALCIUM 40 MG PO TABS: 40.0000 mg | ORAL_TABLET | ORAL | 0 refills | Status: DC

## 2023-12-26 MED ORDER — TRIAMTERENE-HCTZ 75-50 MG PO TABS: 1.0000 | ORAL_TABLET | Freq: Every day | ORAL | 0 refills | Status: DC

## 2023-12-26 NOTE — Progress Notes (Signed)
 Patient ID: Carrie Mcpherson, female    DOB: 04-26-1961  MRN: [id number]  CC: Chronic Conditions Follow-Up  Subjective: Carrie Mcpherson is a 63 y.o. female who presents for chronic conditions follow-up.   Her concerns today include:  - Doing well on Triamterene-Hydrochlorothiazide and Losartan, no issues/concerns. She does not complain of red flag symptoms such as but not limited to chest pain, shortness of breath, worst headache of life, nausea/vomiting.  - States she is only taking Insulin Glargine 30 units instead of prescribed Insulin Glargine 38 units due to "blood sugars drop at night". States she is counting carbohydrates and taking 3 to 5 units of Insulin Aspart. States the lower doses of Insulin Glargine and Insulin Aspart "counteract the low blood sugars" during nighttime. States she is planning to schedule an appointment with Endocrinology soon. Denies red flag symptoms associated with diabetes.  - Doing well on Atorvastatin, no issues/concerns.  - Doing well on Sertraline, no issues/concerns. She denies thoughts of self-harm, suicidal ideations, homicidal ideations. - Doing well on Omeprazole, no issues/concerns.  - States in the past she was seen by Nephrology and was told everything is ok.  Patient Active Problem List   Diagnosis Date Noted   CKD (chronic kidney disease) 07/20/2021   DKA, type 1 (HCC) 07/15/2021   Type 1 diabetes mellitus with ketoacidosis without coma (HCC) 10/26/2019   DKA (diabetic ketoacidosis) (HCC) 10/07/2015   Essential hypertension 10/07/2015   GERD (gastroesophageal reflux disease) 10/07/2015   Depression 10/07/2015   AKI (acute kidney injury) (HCC) 10/07/2015   Acute pharyngitis 10/07/2015   Tobacco abuse 10/07/2015     Current Outpatient Medications on File Prior to Visit  Medication Sig Dispense Refill   aspirin EC 81 MG tablet Take 81 mg by mouth daily.     Continuous Glucose Receiver (FREESTYLE LIBRE 3 READER) DEVI Use to check blood sugar  continuously throughout the day. Change sensor every 15 days. 1 each 0   Continuous Glucose Sensor (FREESTYLE LIBRE 3 PLUS SENSOR) MISC Use to check blood sugar continuously throughout the day. Change sensor every 15 days. 2 each 6   Multiple Vitamins-Minerals (MULTIVITAMIN WITH MINERALS) tablet Take 1 tablet by mouth 3 (three) times a week.      ondansetron (ZOFRAN) 4 MG tablet Take 1 tablet (4 mg total) by mouth every 6 (six) hours as needed for nausea. 20 tablet 0   traMADol (ULTRAM) 50 MG tablet Take 1 tablet (50 mg total) by mouth every 6 (six) hours as needed. 20 tablet 0   triamterene-hydrochlorothiazide (MAXZIDE-25) 37.5-25 MG tablet Take 1 tablet by mouth daily. 90 tablet 0   vitamin B-12 (CYANOCOBALAMIN) 500 MCG tablet Take 500 mcg by mouth daily.     doxycycline (VIBRAMYCIN) 100 MG capsule Take 1 capsule (100 mg total) by mouth 2 (two) times daily. (Patient not taking: Reported on 12/26/2023) 20 capsule 0   HUMALOG 100 UNIT/ML injection SMARTSIG:8 Unit(s) SUB-Q 3 Times Daily (Patient not taking: Reported on 12/26/2023)     Insulin Syringe-Needle U-100 31G X 5/16" 0.5 ML MISC USE TO INJECT Lantus 20 units daily and Humalog 8 units 3 TIMES DAILY with meals 100 each 1   traMADol (ULTRAM) 50 MG tablet Take 1 tablet (50 mg total) by mouth every 6 (six) hours as needed. (Patient not taking: Reported on 12/26/2023) 30 tablet 0   Vitamin D, Ergocalciferol, (DRISDOL) 1.25 MG (50000 UNIT) CAPS capsule Take 1 capsule by mouth once a week (Patient not taking: Reported on  12/26/2023) 12 capsule 0   No current facility-administered medications on file prior to visit.    Allergies  Allergen Reactions   Bactrim [Sulfamethoxazole-Trimethoprim] Itching    Social History   Socioeconomic History   Marital status: Single    Spouse name: Not on file   Number of children: Not on file   Years of education: Not on file   Highest education level: Not on file  Occupational History   Not on file  Tobacco Use    Smoking status: Every Day    Current packs/day: 0.25    Average packs/day: 0.3 packs/day for 30.0 years (7.5 ttl pk-yrs)    Types: Cigarettes    Passive exposure: Current   Smokeless tobacco: Never  Vaping Use   Vaping status: Never Used  Substance and Sexual Activity   Alcohol use: Yes    Comment: occasional   Drug use: No   Sexual activity: Not on file  Other Topics Concern   Not on file  Social History Narrative   Not on file   Social Drivers of Health   Financial Resource Strain: Not on file  Food Insecurity: No Food Insecurity (09/01/2023)   Hunger Vital Sign    Worried About Running Out of Food in the Last Year: Never true    Ran Out of Food in the Last Year: Never true  Transportation Needs: No Transportation Needs (09/01/2023)   PRAPARE - Administrator, Civil Service (Medical): No    Lack of Transportation (Non-Medical): No  Physical Activity: Not on file  Stress: No Stress Concern Present (09/01/2023)   [employer] of Occupational Health - Occupational Stress Questionnaire    Feeling of Stress : Only a little  Social Connections: Not on file  Intimate Partner Violence: Not At Risk (09/01/2023)   Humiliation, Afraid, Rape, and Kick questionnaire    Fear of Current or Ex-Partner: No    Emotionally Abused: No    Physically Abused: No    Sexually Abused: No    Family History  Problem Relation Age of Onset   Hypertension Other    Breast cancer Maternal Aunt     Past Surgical History:  Procedure Laterality Date   COLONOSCOPY WITH PROPOFOL N/A 08/12/2014   Procedure: COLONOSCOPY WITH PROPOFOL;  Surgeon: Charolett Bumpers, MD;  Location: [facility];  Service: Endoscopy;  Laterality: N/A;   ROTATOR CUFF REPAIR Bilateral right 2004, left 1999    ROS: Review of Systems Negative except as stated above  PHYSICAL EXAM: BP (!) 150/88   Pulse 69   Temp 98.7 F (37.1 C) (Oral)   Ht 5' 6.5" (1.689 m)   Wt 164 lb 9.6 oz (74.7 kg)   SpO2 95%    BMI 26.17 kg/m   Physical Exam HENT:     Head: Normocephalic and atraumatic.     Nose: Nose normal.     Mouth/Throat:     Mouth: Mucous membranes are moist.     Pharynx: Oropharynx is clear.  Eyes:     Extraocular Movements: Extraocular movements intact.     Conjunctiva/sclera: Conjunctivae normal.     Pupils: Pupils are equal, round, and reactive to light.  Cardiovascular:     Rate and Rhythm: Normal rate and regular rhythm.     Pulses: Normal pulses.     Heart sounds: Normal heart sounds.  Pulmonary:     Effort: Pulmonary effort is normal.     Breath sounds: Normal breath sounds.  Musculoskeletal:  General: Normal range of motion.     Cervical back: Normal range of motion and neck supple.  Neurological:     General: No focal deficit present.     Mental Status: She is alert and oriented to person, place, and time.  Psychiatric:        Mood and Affect: Mood normal.        Behavior: Behavior normal.    Results for orders placed or performed in visit on 12/26/23  POCT glycosylated hemoglobin (Hb A1C)  Result Value Ref Range   Hemoglobin A1C     HbA1c POC (<> result, manual entry)     HbA1c, POC (prediabetic range)     HbA1c, POC (controlled diabetic range) 8.5 (A) 0.0 - 7.0 %    ASSESSMENT AND PLAN: 1. Primary hypertension (Primary) - Blood pressure not at goal during today's visit. Patient asymptomatic without chest pressure, chest pain, palpitations, shortness of breath, worst headache of life, and any additional red flag symptoms. - Increase Triamterene-Hydrochlorothiazide from 37.5-25 mg to 75-50 mg as prescribed.  - Routine screening.   - Counseled on blood pressure goal of less than 130/80, low-sodium, DASH diet, medication compliance, and 150 minutes of moderate intensity exercise per week as tolerated. Counseled on medication adherence and adverse effects. - Follow-up with primary provider in 4 weeks or sooner if needed.  - Basic Metabolic Panel - losartan  (COZAAR) 100 MG tablet; Take 1 tablet (100 mg total) by mouth daily.  Dispense: 90 tablet; Refill: 0 - triamterene-hydrochlorothiazide (MAXZIDE) 75-50 MG tablet; Take 1 tablet by mouth daily.  Dispense: 90 tablet; Refill: 0  2. Uncontrolled type 1 diabetes mellitus with hyperglycemia (HCC) - Hemoglobin A1c not at goal at 8.5%, goal 8%.  - Patient declined pharmacological adjustment today. - Continue Insulin Glargine and Insulin Aspart as prescribed. Counseled on medication adherence/adverse effects.  - Referral to Endocrinology for evaluation/management.  - Follow-up with primary provider as scheduled. - POCT glycosylated hemoglobin (Hb A1C) - Ambulatory referral to Endocrinology - Insulin Glargine (BASAGLAR KWIKPEN) 100 UNIT/ML; Inject 30 Units into the skin daily.  Dispense: 15 mL; Refill: 1 - insulin aspart (NOVOLOG) 100 UNIT/ML injection; Inject 8 Units into the skin 3 (three) times daily before meals.  Dispense: 20 mL; Refill: 1  3. Hyperlipidemia, unspecified hyperlipidemia type - Continue Atorvastatin as prescribed. Counseled on medication adherence/adverse effects.  - Follow-up with primary provider in 3 months or sooner if needed. - atorvastatin (LIPITOR) 40 MG tablet; Take 1 tablet (40 mg total) by mouth every morning.  Dispense: 90 tablet; Refill: 0  4. Anxiety and depression - Patient denies thoughts of self-harm, suicidal ideations, homicidal ideations. - Continue Sertraline as prescribed. Counseled on medication adherence/adverse effects. - Follow-up with primary provider in 3 months or sooner if needed.  - sertraline (ZOLOFT) 100 MG tablet; Take 1 tablet (100 mg total) by mouth daily for 90 doses.  Dispense: 90 tablet; Refill: 0  5. Gastroesophageal reflux disease, unspecified whether esophagitis present - Continue Omeprazole as prescribed. Counseled on medication adherence/adverse effects. - Follow-up with primary provider in 3 months or sooner if needed. - omeprazole  (PRILOSEC) 40 MG capsule; Take 1 capsule (40 mg total) by mouth daily.  Dispense: 90 capsule; Refill: 0  6. Immunization due - Administered. - PNEUMOCOCCAL CONJUGATE VACCINE 15-VALENT   Patient was given the opportunity to ask questions.  Patient verbalized understanding of the plan and was able to repeat key elements of the plan. Patient was given clear instructions to go  to Emergency Department or return to medical center if symptoms don't improve, worsen, or new problems develop.The patient verbalized understanding.   Orders Placed This Encounter  Procedures   PNEUMOCOCCAL CONJUGATE VACCINE 15-VALENT   Basic Metabolic Panel   Ambulatory referral to Endocrinology   POCT glycosylated hemoglobin (Hb A1C)     Requested Prescriptions   Signed Prescriptions Disp Refills   losartan (COZAAR) 100 MG tablet 90 tablet 0    Sig: Take 1 tablet (100 mg total) by mouth daily.   triamterene-hydrochlorothiazide (MAXZIDE) 75-50 MG tablet 90 tablet 0    Sig: Take 1 tablet by mouth daily.   atorvastatin (LIPITOR) 40 MG tablet 90 tablet 0    Sig: Take 1 tablet (40 mg total) by mouth every morning.   omeprazole (PRILOSEC) 40 MG capsule 90 capsule 0    Sig: Take 1 capsule (40 mg total) by mouth daily.   sertraline (ZOLOFT) 100 MG tablet 90 tablet 0    Sig: Take 1 tablet (100 mg total) by mouth daily for 90 doses.   Insulin Glargine (BASAGLAR KWIKPEN) 100 UNIT/ML 15 mL 1    Sig: Inject 30 Units into the skin daily.   insulin aspart (NOVOLOG) 100 UNIT/ML injection 20 mL 1    Sig: Inject 8 Units into the skin 3 (three) times daily before meals.    Return in about 4 weeks (around 01/23/2024) for Follow-Up or next available chronic conditions.  Rema Fendt, NP

## 2023-12-26 NOTE — Progress Notes (Signed)
 Patient states no concerns to discuss.  Patient wants Pneumonia vaccine.

## 2023-12-27 ENCOUNTER — Other Ambulatory Visit: Payer: Self-pay

## 2023-12-27 LAB — BASIC METABOLIC PANEL
BUN/Creatinine Ratio: 25 (ref 12–28)
BUN: 22 mg/dL (ref 8–27)
CO2: 23 mmol/L (ref 20–29)
Calcium: 9.9 mg/dL (ref 8.7–10.3)
Chloride: 103 mmol/L (ref 96–106)
Creatinine, Ser: 0.88 mg/dL (ref 0.57–1.00)
Glucose: 113 mg/dL — ABNORMAL HIGH (ref 70–99)
Potassium: 4.6 mmol/L (ref 3.5–5.2)
Sodium: 141 mmol/L (ref 134–144)
eGFR: 73 mL/min/{1.73_m2} (ref >59)

## 2023-12-28 ENCOUNTER — Other Ambulatory Visit: Payer: Self-pay

## 2023-12-31 NOTE — Progress Notes (Signed)
 S:     Chief Complaint  Patient presents with   Diabetes   63 y.o. female with PMHx significant for HTN, GERD, T1DM (with history of DKA), CKD, and depression who presents for diabetes evaluation, education, and management.   Patient was referred by Primary Care Provider, Ricky Stabs, on 09/01/2023, last seen on 12/26/23. She was last seen by pharmacy on 11/02/2023. At that time she reported medication non-adherence, as she ran out of Basaglar and was unable to get it from her pharmacy - [facility]. She counts carbohydrates to dose mealtime insulin. At last appt with PCP, A1C improved from 10% to 8.5%. BP was elevated at 150/88 mmHg and triamterene-hydrochlorothiazide was increased to 75-50 mg daily.  Patient reports Diabetes was diagnosed in 1999. Per patient she was intially diagnosed as T2DM. She was started on insulin therapy in 2003.   Patient arrives in good spirits and presents without any assistance. She reports that she has good supplies of both her basal and prandial insulin. She is consistently taking Basaglar 30 units daily. She does admit to occasionally forgetting Humalog - as she is busy taking care of her 70 YO mother. She reports that she does not typically take insulin with snacks (such as a pack of crackers- 25 carbs, or chips and dip - ~20 carbs). She does not always take prandial insulin if her pre-prandial BG is < 120 mg/dL. She does not currently have an active FL3 sensor on, but will be picking up a refill from the pharmacy today. She does not have her reader or glucometer with her to review recent BG. Patient reports that a couple nights a week her BG will drop before bed or overnight. She typically keeps 1/2 of a PB and jelly sandwich nearby overnight to treat hypoglycemia.   Family/Social History:  -Family history: HTN, breast cancer -Tobacco: current 0.3 PPD smoker -Alcohol: not reported   Current diabetes medications include:  -insulin aspart prescribed as 8 units TID  before meals - she counts carbohydrates, reports using ICR ~ 1:8-12 -Basaglar 30 units once daily (during the day)  Patient reports adherence is optimal.   Patient reports hypoglycemic events. Last night BG of 69 after eating beef pot pie for dinner (estimated 35 carbs, dosed 4 units of Humalog) - did not have symptoms. Reports that if she is moving around she is more likely to notice symptoms of hypoglycemia including weakness. Reports BG of 53 a couple weeks ago, but does not recall the situation that led to the hypo event.   Patient reports nocturia (nighttime urination) and polyuria. However is not sure if this is due to her blood sugar or increased fluid intake (she drinks about 40 oz of water per day). Patient denies neuropathy (nerve pain). Patient denies visual changes. Patient reports self foot exams. No issues noted.  Patient reported dietary habits: reports that she has lost weight over the past couple years taking care of her mother. She may forget to eat lunch and dinner occasionally.   Patient reported exercise: currently limited by ankle fracture   O:  Freestyle Libre 3 Plus CGM Data (last 7 days)  - last had an active sensor on Friday or Saturday. Has been checking with glucometer instead - she cannot recall BG today.    Lab Results  Component Value Date   HGBA1C 8.5 (A) 12/26/2023   There were no vitals filed for this visit.  Lipid Panel     Component Value Date/Time  CHOL 181 09/01/2023 0902   TRIG 75 09/01/2023 0902   HDL 59 09/01/2023 0902   CHOLHDL 3.1 09/01/2023 0902   CHOLHDL 3.2 Ratio 11/18/2010 2042   VLDL 23 11/18/2010 2042   LDLCALC 108 (H) 09/01/2023 0902    Clinical Atherosclerotic Cardiovascular Disease (ASCVD): No  The 10-year ASCVD risk score (Arnett DK, et al., 2019) is: 27.6%   Values used to calculate the score:     Age: 53 years     Sex: Female     Is Non-Hispanic African American: No     Diabetic: Yes     Tobacco smoker: Yes      Systolic Blood Pressure: 150 mmHg     Is BP treated: Yes     HDL Cholesterol: 59 mg/dL     Total Cholesterol: 181 mg/dL   Patient is participating in a Managed Medicaid Plan:  No   A/P: Diabetes longstanding currently uncontrolled. A1c of 8.5% above goal < 7%, but improved from 10%. Patient is able to verbalize appropriate hypoglycemia management plan. Discussed tightening glycemic control by adjusting insulin:carbohydrate ratio (ICR) To 1:10. Patient is currently using a variable ICR, which contributes to risk of hypoglycemia. Advised patient to begin dosing insulin with carbohydrate containing snacks. Patient planning to reestablish with endocrinology.  -Continued basal insulin Basaglar (insulin glargine) 30 units once daily  -Continued insulin lispro TID with meals - dose using a ICR of 1:10, up to 8 units with each meal -Extensively discussed pathophysiology of diabetes, recommended lifestyle interventions, dietary effects on blood sugar control.  -Instructed patient to restart FL3 sensor and bring reader and glucometer to follow-up appointment.  -Patient to go by endocrinology office after appointment today to schedule follow-up. -Counseled on s/sx of and management of hypoglycemia.  -Next A1c anticipated February 2025.   Follow-up:  Pharmacist: 02/26/24 Endocrinology: to be scheduled  Nils Pyle, PharmD PGY1 Pharmacy Resident

## 2024-01-01 ENCOUNTER — Other Ambulatory Visit: Payer: Self-pay

## 2024-01-01 ENCOUNTER — Ambulatory Visit: Payer: Commercial Managed Care - HMO | Attending: Family | Admitting: Pharmacist

## 2024-01-01 DIAGNOSIS — Z794 Long term (current) use of insulin: Secondary | ICD-10-CM | POA: Diagnosis not present

## 2024-01-01 DIAGNOSIS — E1010 Type 1 diabetes mellitus with ketoacidosis without coma: Secondary | ICD-10-CM

## 2024-01-01 NOTE — Patient Instructions (Addendum)
 It was great to see you today.   With your meal time insulin (Humalog), start dosing 1 unit for every 10 carbohydrates you eat for a snack or meal. If you are eating a small snack, and your blood sugar is less than 100 mg/dL, hold the meal time insulin.   Continue insulin glargine (Basaglar) 30 units once daily  Start wearing your Freestyle Libre Sensor again and we will see you again in about a month!

## 2024-01-09 ENCOUNTER — Other Ambulatory Visit (INDEPENDENT_AMBULATORY_CARE_PROVIDER_SITE_OTHER): Payer: Self-pay

## 2024-01-09 ENCOUNTER — Ambulatory Visit: Payer: Commercial Managed Care - HMO | Admitting: Orthopedic Surgery

## 2024-01-09 DIAGNOSIS — M25571 Pain in right ankle and joints of right foot: Secondary | ICD-10-CM | POA: Diagnosis not present

## 2024-01-14 ENCOUNTER — Encounter: Payer: Self-pay | Admitting: Orthopedic Surgery

## 2024-01-14 NOTE — Progress Notes (Signed)
 Office Visit Note   Patient: Carrie Mcpherson           Date of Birth: [birth year]           MRN: [id number] Visit Date: 01/09/2024              Requested by: Rema Fendt, NP [address] [place],  [place] [address] PCP: Rema Fendt, NP  Chief Complaint  Patient presents with   Right Ankle - Follow-up      HPI: Patient is a 63 year old woman who presents in follow-up for right ankle injury.  She states she is improving her range of motion.  Assessment & Plan: Visit Diagnoses:  1. Pain in right ankle and joints of right foot     Plan: Recommended she wean out of the fracture boot continuing with range of motion and strengthening.  Follow-Up Instructions: No follow-ups on file.   Ortho Exam  Patient is alert, oriented, no adenopathy, well-dressed, normal affect, normal respiratory effort. Examination patient has a palpable pulse she has improved ankle range of motion she still has tenderness to palpation of the anterior talofibular ligament.  Anterior drawer is stable.  Distal tibia and fibula are not tender to palpation the syndesmosis is not tender to palpation.  Imaging: No results found. No images are attached to the encounter.  Labs: Lab Results  Component Value Date   HGBA1C 8.5 (A) 12/26/2023   HGBA1C 10.0 (A) 09/01/2023   HGBA1C 7.7 (A) 11/02/2022     Lab Results  Component Value Date   ALBUMIN 4.4 06/17/2022   ALBUMIN 3.0 (L) 10/08/2015   ALBUMIN 4.5 10/07/2015    Lab Results  Component Value Date   MG 2.0 07/16/2021   Lab Results  Component Value Date   VD25OH 21.8 (L) 02/15/2023   VD25OH 30 12/10/2009   VD25OH 19 (L) 08/28/2009    No results found for: "PREALBUMIN"    Latest Ref Rng & Units 11/23/2021   11:46 AM 07/15/2021    7:01 AM 07/14/2021   10:50 PM  CBC EXTENDED  WBC 3.4 - 10.8 x10E3/uL 6.5  12.3  12.8   RBC 3.77 - 5.28 x10E6/uL 5.15  3.93  4.58   Hemoglobin 11.1 - 15.9 g/dL 29.5  28.4  13.2   HCT 34.0 - 46.6 %  44.8  32.8  40.3   Platelets 150 - 450 x10E3/uL 228  185  225      There is no height or weight on file to calculate BMI.  Orders:  Orders Placed This Encounter  Procedures   XR Ankle Complete Right   No orders of the defined types were placed in this encounter.    Procedures: No procedures performed  Clinical Data: No additional findings.  ROS:  All other systems negative, except as noted in the HPI. Review of Systems  Objective: Vital Signs: There were no vitals taken for this visit.  Specialty Comments:  No specialty comments available.  PMFS History: Patient Active Problem List   Diagnosis Date Noted   CKD (chronic kidney disease) 07/20/2021   DKA, type 1 (HCC) 07/15/2021   Type 1 diabetes mellitus with ketoacidosis without coma (HCC) 10/26/2019   DKA (diabetic ketoacidosis) (HCC) 10/07/2015   Essential hypertension 10/07/2015   GERD (gastroesophageal reflux disease) 10/07/2015   Depression 10/07/2015   AKI (acute kidney injury) (HCC) 10/07/2015   Acute pharyngitis 10/07/2015   Tobacco abuse 10/07/2015   Past Medical History:  Diagnosis Date  Blood clot in vein 1994    superficial after right ankle fracture   Depression    Diabetes mellitus without complication (HCC)    GERD (gastroesophageal reflux disease)    occasional   Hypertension    PONV (postoperative nausea and vomiting)    vomiting after one surgery x 1   Sciatic leg pain    right leg   Sleep apnea sept 2014   borderline, no cpap needed    Family History  Problem Relation Age of Onset   Hypertension Other    Breast cancer Maternal Aunt     Past Surgical History:  Procedure Laterality Date   COLONOSCOPY WITH PROPOFOL N/A 08/12/2014   Procedure: COLONOSCOPY WITH PROPOFOL;  Surgeon: Charolett Bumpers, MD;  Location: [facility];  Service: Endoscopy;  Laterality: N/A;   ROTATOR CUFF REPAIR Bilateral right 2004, left 1999   Social History   Occupational History   Not on file   Tobacco Use   Smoking status: Every Day    Current packs/day: 0.25    Average packs/day: 0.3 packs/day for 30.0 years (7.5 ttl pk-yrs)    Types: Cigarettes    Passive exposure: Current   Smokeless tobacco: Never  Vaping Use   Vaping status: Never Used  Substance and Sexual Activity   Alcohol use: Yes    Comment: occasional   Drug use: No   Sexual activity: Not on file

## 2024-01-22 ENCOUNTER — Other Ambulatory Visit: Payer: Self-pay

## 2024-02-01 ENCOUNTER — Other Ambulatory Visit: Payer: Self-pay

## 2024-02-02 ENCOUNTER — Other Ambulatory Visit: Payer: Self-pay

## 2024-02-25 NOTE — Progress Notes (Deleted)
 S:     No chief complaint on file.  63 y.o. female with PMHx significant for HTN, GERD, T1DM (with history of DKA), CKD, and depression who presents for diabetes evaluation, education, and management.   Patient was referred by Primary Care Provider, Lavona Pounds, on 09/01/2023, last seen on 12/26/23. She seen by pharmacy on 11/02/2023. At that time she reported medication non-adherence, as she ran out of Basaglar  and was unable to get it from her pharmacy - [facility]. She counts carbohydrates to dose mealtime insulin . At last appt with PCP, A1C improved from 10% to 8.5%. BP was elevated at 150/88 mmHg and triamterene -hydrochlorothiazide  was increased to 75-50 mg daily. Patient was last seen by pharmacy on 01/01/24 at which time she reported occasionally forgetting prandial insulin  and using a variable ICR to dose prandial insulin . She reported several episodes of overnight hypoglycemia weekly. She was instructed to continue Basaglar  30 units daily, start taking Novalog with every meal and snack using an ICR ratio of 1:10 (up to 8 units with meals), and restart using Freestyle Libre 3 CGM.   Patient reports Diabetes was diagnosed in 1999. Per patient she was intially diagnosed as T2DM. She was started on insulin  therapy in 2003.   Patient arrives in good spirits and presents without any assistance. *** She reports that she has good supplies of both her basal and prandial insulin . She is consistently taking Basaglar  30 units daily. She does admit to occasionally forgetting Humalog  - as she is busy taking care of her 61 YO mother. She reports that she does not typically take insulin  with snacks (such as a pack of crackers- 25 carbs, or chips and dip - ~20 carbs). She does not always take prandial insulin  if her pre-prandial BG is < 120 mg/dL. She does not currently have an active FL3 sensor on, but will be picking up a refill from the pharmacy today. She does not have her reader or glucometer with her to  review recent BG. Patient reports that a couple nights a week her BG will drop before bed or overnight. She typically keeps 1/2 of a PB and jelly sandwich nearby overnight to treat hypoglycemia.   Still needs to schedule endo  Family/Social History:  -Family history: HTN, breast cancer -Tobacco: current 0.3 PPD smoker*** -Alcohol: not reported   Current diabetes medications include:  -insulin  aspart prescribed as 8 units TID before meals - she counts carbohydrates, reports using ICR ~ 1:8-12 -Basaglar  30 units once daily (during the day)  Patient reports adherence is optimal.   Patient reports hypoglycemic events. Last night BG of 69 after eating beef pot pie for dinner (estimated 35 carbs, dosed 4 units of Humalog ) - did not have symptoms. Reports that if she is moving around she is more likely to notice symptoms of hypoglycemia including weakness. Reports BG of 53 a couple weeks ago, but does not recall the situation that led to the hypo event.   Patient reports nocturia (nighttime urination) and polyuria. However is not sure if this is due to her blood sugar or increased fluid intake (she drinks about 40 oz of water per day). Patient denies neuropathy (nerve pain). Patient denies visual changes. Patient reports self foot exams. No issues noted.***  Patient reported dietary habits: reports that she has lost weight over the past couple years taking care of her mother. She may forget to eat lunch and dinner occasionally. ***  Patient reported exercise: currently limited by ankle fracture  O:  Freestyle Libre 3 Plus CGM Data (last 7 days)  - last had an active sensor on Friday or Saturday. Has been checking with glucometer instead - she cannot recall BG today. ***   Lab Results  Component Value Date   HGBA1C 8.5 (A) 12/26/2023   There were no vitals filed for this visit.  Lipid Panel     Component Value Date/Time   CHOL 181 09/01/2023 0902   TRIG 75 09/01/2023 0902   HDL 59  09/01/2023 0902   CHOLHDL 3.1 09/01/2023 0902   CHOLHDL 3.2 Ratio 11/18/2010 2042   VLDL 23 11/18/2010 2042   LDLCALC 108 (H) 09/01/2023 0902    Clinical Atherosclerotic Cardiovascular Disease (ASCVD): No  The 10-year ASCVD risk score (Arnett DK, et al., 2019) is: 27.6%   Values used to calculate the score:     Age: 60 years     Sex: Female     Is Non-Hispanic African American: No     Diabetic: Yes     Tobacco smoker: Yes     Systolic Blood Pressure: 150 mmHg     Is BP treated: Yes     HDL Cholesterol: 59 mg/dL     Total Cholesterol: 181 mg/dL   Patient is participating in a Managed Medicaid Plan:  No   A/P: Diabetes longstanding currently uncontrolled. A1c of 8.5% above goal < 7%, but improved from 10%. Patient is able to verbalize appropriate hypoglycemia management plan. Discussed tightening glycemic control by adjusting insulin :carbohydrate ratio (ICR) To 1:10. Patient is currently using a variable ICR, which contributes to risk of hypoglycemia. Advised patient to begin dosing insulin  with carbohydrate containing snacks. Patient planning to reestablish with endocrinology.  -Continued basal insulin  Basaglar  (insulin  glargine) 30 units once daily  -Continued insulin  lispro TID with meals - dose using a ICR of 1:10, up to 8 units with each meal -Extensively discussed pathophysiology of diabetes, recommended lifestyle interventions, dietary effects on blood sugar control.  -Instructed patient to restart FL3 sensor and bring reader and glucometer to follow-up appointment.  -Patient to go by endocrinology office after appointment today to schedule follow-up. -Counseled on s/sx of and management of hypoglycemia.  -Next A1c anticipated February 2025.   Follow-up:  Pharmacist: 1 mo*** Endocrinology: to be scheduled  Arthea Larsson, PharmD PGY1 Pharmacy Resident

## 2024-02-26 ENCOUNTER — Ambulatory Visit: Admitting: Pharmacist

## 2024-02-26 ENCOUNTER — Ambulatory Visit: Payer: Self-pay

## 2024-02-26 NOTE — Telephone Encounter (Signed)
  Chief Complaint: nausea Symptoms: nausea Frequency: this morning Pertinent Negatives: Patient denies fever, abdominal pain, vomiting Disposition: [] ED /[] Urgent Care (no appt availability in office) / [] Appointment(In office/virtual)/ []  [facility]/ [x] Home Care/ [] Refused Recommended Disposition /[] [facility]/ []  Follow-up with PCP Additional Notes: Patient reports she believes she caught the stomach virus from her mother. Patient reports she is experiencing nausea but has not had any episodes of vomiting. Patient denies all other symptoms at this time. Patient advised of home care for current symptoms and is requesting to cancel her current 4 month follow-up appt that was previously scheduled. Patient advised to call back with worsening symptoms. Patient verbalized understanding.    Copied from CRM [id number]. Topic: Clinical - Red Word Triage >> Feb 26, 2024  9:06 AM Oddis Bench wrote: Red Word that prompted transfer to Nurse Triage: Patient is calling she is nauseated and she is not feeling well, she was calling in to cancel the appt she has for this afternoon. Reason for Disposition  Unexplained nausea  Answer Assessment - Initial Assessment Questions 1. NAUSEA SEVERITY: "How bad is the nausea?" (e.g., mild, moderate, severe; dehydration, weight loss)   - MILD: loss of appetite without change in eating habits   - MODERATE: decreased oral intake without significant weight loss, dehydration, or malnutrition   - SEVERE: inadequate caloric or fluid intake, significant weight loss, symptoms of dehydration     mild 2. ONSET: "When did the nausea begin?"     This morning 3. VOMITING: "Any vomiting?" If Yes, ask: "How many times today?"     denies 4. RECURRENT SYMPTOM: "Have you had nausea before?" If Yes, ask: "When was the last time?" "What happened that time?"     denies 5. CAUSE: "What do you think is causing the nausea?"  Mother has a stomach virus, so thinks she  has that  Protocols used: Nausea-A-AH

## 2024-02-26 NOTE — Telephone Encounter (Signed)
 Report to Emergency Department/Urgent Care/call 911 for immediate medical evaluation. Follow-up with Primary Care.

## 2024-02-29 ENCOUNTER — Other Ambulatory Visit: Payer: Self-pay

## 2024-03-25 ENCOUNTER — Encounter: Payer: Self-pay | Admitting: Family

## 2024-03-25 ENCOUNTER — Ambulatory Visit (INDEPENDENT_AMBULATORY_CARE_PROVIDER_SITE_OTHER): Admitting: Family

## 2024-03-25 VITALS — BP 155/84 | HR 64 | Temp 98.3°F | Resp 16 | Ht 66.5 in | Wt 187.0 lb

## 2024-03-25 DIAGNOSIS — Z91198 Patient's noncompliance with other medical treatment and regimen for other reason: Secondary | ICD-10-CM

## 2024-03-25 DIAGNOSIS — E785 Hyperlipidemia, unspecified: Secondary | ICD-10-CM

## 2024-03-25 DIAGNOSIS — Z0100 Encounter for examination of eyes and vision without abnormal findings: Secondary | ICD-10-CM

## 2024-03-25 DIAGNOSIS — F419 Anxiety disorder, unspecified: Secondary | ICD-10-CM

## 2024-03-25 DIAGNOSIS — F418 Other specified anxiety disorders: Secondary | ICD-10-CM

## 2024-03-25 DIAGNOSIS — K219 Gastro-esophageal reflux disease without esophagitis: Secondary | ICD-10-CM

## 2024-03-25 DIAGNOSIS — E119 Type 2 diabetes mellitus without complications: Secondary | ICD-10-CM

## 2024-03-25 DIAGNOSIS — Z794 Long term (current) use of insulin: Secondary | ICD-10-CM | POA: Diagnosis not present

## 2024-03-25 DIAGNOSIS — Z91148 Patient's other noncompliance with medication regimen for other reason: Secondary | ICD-10-CM

## 2024-03-25 DIAGNOSIS — E1065 Type 1 diabetes mellitus with hyperglycemia: Secondary | ICD-10-CM

## 2024-03-25 DIAGNOSIS — I10 Essential (primary) hypertension: Secondary | ICD-10-CM

## 2024-03-25 MED ORDER — ATORVASTATIN CALCIUM 40 MG PO TABS: 40.0000 mg | ORAL_TABLET | ORAL | 0 refills | Status: AC

## 2024-03-25 MED ORDER — INSULIN ASPART 100 UNIT/ML IJ SOLN
8.0000 [IU] | Freq: Three times a day (TID) | INTRAMUSCULAR | 1 refills | Status: AC
Start: 2024-03-25 — End: ?

## 2024-03-25 MED ORDER — INSULIN SYRINGE-NEEDLE U-100 31G X 5/16" 0.5 ML MISC: 4 refills | Status: AC

## 2024-03-25 MED ORDER — OMEPRAZOLE 40 MG PO CPDR: 40.0000 mg | DELAYED_RELEASE_CAPSULE | Freq: Every day | ORAL | 0 refills | Status: AC

## 2024-03-25 MED ORDER — LOSARTAN POTASSIUM 100 MG PO TABS: 100.0000 mg | ORAL_TABLET | Freq: Every day | ORAL | 0 refills | Status: AC

## 2024-03-25 MED ORDER — SERTRALINE HCL 100 MG PO TABS: 100.0000 mg | ORAL_TABLET | Freq: Every day | ORAL | 0 refills | Status: AC

## 2024-03-25 MED ORDER — TRIAMTERENE-HCTZ 75-50 MG PO TABS: 1.0000 | ORAL_TABLET | Freq: Every day | ORAL | 0 refills | Status: AC

## 2024-03-25 MED ORDER — BASAGLAR KWIKPEN 100 UNIT/ML ~~LOC~~ SOPN: 30.0000 [IU] | PEN_INJECTOR | Freq: Every day | SUBCUTANEOUS | 1 refills | Status: DC

## 2024-03-25 NOTE — Progress Notes (Signed)
 Patient ID: Carrie Mcpherson, female    DOB: 04-30-1961  MRN: [id number]  CC: Chronic Conditions Follow-Up  Subjective: Carrie Mcpherson is a 63 y.o. female who presents for chronic conditions follow-up.  Her concerns today include:  - Doing well on Triamterene -Hydrochlorothiazide  and Losartan , no issues/concerns. 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 She does not complain of red flag symptoms such as but not limited to chest pain, shortness of breath, worst headache of life, nausea/vomiting.  - Doing well on Insulin  Glargine and Insulin  Aspart, no issues/concerns. Denies red flag symptoms associated with diabetes. States she has an appointment with Endocrinology next month.  - Due for diabetic eye exam. - Due for diabetic foot exam. - Doing well on Atorvastatin , no issues/concerns. - Doing well on Sertraline , no issues/concerns. She denies thoughts of self-harm, suicidal ideations, homicidal ideations. - Doing well on Omeprazole , no issues/concerns.  Patient Active Problem List   Diagnosis Date Noted   CKD (chronic kidney disease) 07/20/2021   DKA, type 1 (HCC) 07/15/2021   Type 1 diabetes mellitus with ketoacidosis without coma (HCC) 10/26/2019   DKA (diabetic ketoacidosis) (HCC) 10/07/2015   Essential hypertension 10/07/2015   GERD (gastroesophageal reflux disease) 10/07/2015   Depression 10/07/2015   AKI (acute kidney injury) (HCC) 10/07/2015   Acute pharyngitis 10/07/2015   Tobacco abuse 10/07/2015     Current Outpatient Medications on File Prior to Visit  Medication Sig Dispense Refill   aspirin  EC 81 MG tablet Take 81 mg by mouth daily.     Continuous Glucose Receiver (FREESTYLE LIBRE 3 READER) DEVI Use to check blood sugar continuously throughout the day. Change sensor every 15 days. 1 each 0   Continuous Glucose Sensor (FREESTYLE LIBRE 3 PLUS SENSOR) MISC Use to check blood sugar continuously throughout the  day. Change sensor every 15 days. 2 each 6   HUMALOG  100 UNIT/ML injection      Multiple Vitamins-Minerals (MULTIVITAMIN WITH MINERALS) tablet Take 1 tablet by mouth 3 (three) times a week.      ondansetron  (ZOFRAN ) 4 MG tablet Take 1 tablet (4 mg total) by mouth every 6 (six) hours as needed for nausea. 20 tablet 0   traMADol  (ULTRAM ) 50 MG tablet Take 1 tablet (50 mg total) by mouth every 6 (six) hours as needed. 20 tablet 0   traMADol  (ULTRAM ) 50 MG tablet Take 1 tablet (50 mg total) by mouth every 6 (six) hours as needed. (Patient not taking: Reported on 12/26/2023) 30 tablet 0   vitamin B-12 (CYANOCOBALAMIN) 500 MCG tablet Take 500 mcg by mouth daily.     Vitamin D , Ergocalciferol , (DRISDOL ) 1.25 MG (50000 UNIT) CAPS capsule Take 1 capsule by mouth once a week (Patient not taking: Reported on 12/26/2023) 12 capsule 0   No current facility-administered medications on file prior to visit.    Allergies  Allergen Reactions   Bactrim  [Sulfamethoxazole -Trimethoprim ] Itching    Social History   Socioeconomic History   Marital status: Single    Spouse name: Not on file   Number of children: Not on file   Years of education: Not on file   Highest education level: Some college, no degree  Occupational History   Not on file  Tobacco Use   Smoking status: Every Day    Current packs/day: 0.25    Average packs/day: 0.3 packs/day for 30.0 years (7.5 ttl pk-yrs)    Types: Cigarettes    Passive exposure:  Current   Smokeless tobacco: Never  Vaping Use   Vaping status: Never Used  Substance and Sexual Activity   Alcohol use: Yes    Comment: occasional   Drug use: No   Sexual activity: Not on file  Other Topics Concern   Not on file  Social History Narrative   Not on file   Social Drivers of Health   Financial Resource Strain: Low Risk  (01/01/2024)   Overall Financial Resource Strain (CARDIA)    Difficulty of Paying Living Expenses: Not very hard  Food Insecurity: Food Insecurity  Present (01/01/2024)   Hunger Vital Sign    Worried About Running Out of Food in the Last Year: Sometimes true    Ran Out of Food in the Last Year: Never true  Transportation Needs: No Transportation Needs (01/01/2024)   PRAPARE - Administrator, Civil Service (Medical): No    Lack of Transportation (Non-Medical): No  Physical Activity: Insufficiently Active (01/01/2024)   Exercise Vital Sign    Days of Exercise per Week: 3 days    Minutes of Exercise per Session: 20 min  Stress: No Stress Concern Present (01/01/2024)   [employer] of Occupational Health - Occupational Stress Questionnaire    Feeling of Stress : Only a little  Social Connections: Moderately Isolated (01/01/2024)   Social Connection and Isolation Panel [NHANES]    Frequency of Communication with Friends and Family: Once a week    Frequency of Social Gatherings with Friends and Family: Once a week    Attends Religious Services: 1 to 4 times per year    Active Member of [employer] or Organizations: Yes    Attends Banker Meetings: 1 to 4 times per year    Marital Status: Divorced  Intimate Partner Violence: Not At Risk (09/01/2023)   Humiliation, Afraid, Rape, and Kick questionnaire    Fear of Current or Ex-Partner: No    Emotionally Abused: No    Physically Abused: No    Sexually Abused: No    Family History  Problem Relation Age of Onset   Hypertension Other    Breast cancer Maternal Aunt     Past Surgical History:  Procedure Laterality Date   COLONOSCOPY WITH PROPOFOL  N/A 08/12/2014   Procedure: COLONOSCOPY WITH PROPOFOL ;  Surgeon: Garrett Kallman, MD;  Location: [facility];  Service: Endoscopy;  Laterality: N/A;   ROTATOR CUFF REPAIR Bilateral right 2004, left 1999    ROS: Review of Systems Negative except as stated above  PHYSICAL EXAM: BP (!) 155/84   Pulse 64   Temp 98.3 F (36.8 C) (Oral)   Resp 16   Ht 5' 6.5" (1.689 m)   Wt 187 lb (84.8 kg)   SpO2 94%   BMI 29.73  kg/m   Physical Exam HENT:     Head: Normocephalic and atraumatic.     Nose: Nose normal.     Mouth/Throat:     Mouth: Mucous membranes are moist.     Pharynx: Oropharynx is clear.  Eyes:     Extraocular Movements: Extraocular movements intact.     Conjunctiva/sclera: Conjunctivae normal.     Pupils: Pupils are equal, round, and reactive to light.  Cardiovascular:     Rate and Rhythm: Normal rate and regular rhythm.     Pulses: Normal pulses.     Heart sounds: Normal heart sounds.  Pulmonary:     Effort: Pulmonary effort is normal.     Breath sounds: Normal breath sounds.  Musculoskeletal:        General: Normal range of motion.     Cervical back: Normal range of motion and neck supple.  Neurological:     General: No focal deficit present.     Mental Status: She is alert and oriented to person, place, and time.  Psychiatric:        Mood and Affect: Mood normal.        Behavior: Behavior normal.     ASSESSMENT AND PLAN: 1. Primary hypertension (Primary) 2. Nonadherence to medication - Blood pressure not at goal during today's visit. Patient asymptomatic without chest pressure, chest pain, palpitations, shortness of breath, worst headache of life, and any additional red flag symptoms. - Triamterene -Hydrochlorothiazide  and Losartan  as prescribed. - Counseled on blood pressure goal of less than 130/80, low-sodium, DASH diet, medication compliance, and 150 minutes of moderate intensity exercise per week as tolerated. Counseled on medication adherence and adverse effects. - Follow-up with primary provider in 4 weeks or sooner if needed. - triamterene -hydrochlorothiazide  (MAXZIDE ) 75-50 MG tablet; Take 1 tablet by mouth daily.  Dispense: 90 tablet; Refill: 0 - losartan  (COZAAR ) 100 MG tablet; Take 1 tablet (100 mg total) by mouth daily.  Dispense: 90 tablet; Refill: 0  3. Uncontrolled type 1 diabetes mellitus with hyperglycemia (HCC) - Continue Insulin  Glargine and Insulin  Aspart  as prescribed.  - Hemoglobin A1c result pending.  - Discussed the importance of healthy eating habits, low-carbohydrate diet, low-sugar diet, regular aerobic exercise (at least 150 minutes a week as tolerated) and medication compliance to achieve or maintain control of diabetes. Counseled on medication adherence/adverse effects. - Keep upcoming appointment with Endocrinology.  - Follow-up with primary provider as scheduled. - insulin  aspart (NOVOLOG ) 100 UNIT/ML injection; Inject 8 Units into the skin 3 (three) times daily before meals.  Dispense: 20 mL; Refill: 1 - Insulin  Glargine (BASAGLAR  KWIKPEN) 100 UNIT/ML; Inject 30 Units into the skin daily.  Dispense: 15 mL; Refill: 1 - POCT glycosylated hemoglobin (Hb A1C)  4. Diabetic eye exam [facility]) - Referral to Ophthalmology for evaluation/management. - Ambulatory referral to Ophthalmology  5. Encounter for diabetic foot exam [facility]) - Referral to Podiatry for evaluation/management. - Ambulatory referral to Podiatry  6. Hyperlipidemia, unspecified hyperlipidemia type - Continue Atorvastatin  as prescribed. Counseled on medication adherence/adverse effects.  - Follow-up with primary provider in 3 months or sooner if needed. - atorvastatin  (LIPITOR) 40 MG tablet; Take 1 tablet (40 mg total) by mouth every morning.  Dispense: 90 tablet; Refill: 0  7. Anxiety and depression - Patient denies thoughts of self-harm, suicidal ideations, homicidal ideations. - Continue Sertraline  as prescribed. Counseled on medication adherence/adverse effects.  - Follow-up with primary provider in 3 months or sooner if needed. - sertraline  (ZOLOFT ) 100 MG tablet; Take 1 tablet (100 mg total) by mouth daily for 90 doses.  Dispense: 90 tablet; Refill: 0  8. Gastroesophageal reflux disease, unspecified whether esophagitis present - Continue Omeprazole  as prescribed. Counseled on medication adherence/adverse effects.  - Follow-up with primary provider in 3 months or  sooner if needed. - omeprazole  (PRILOSEC) 40 MG capsule; Take 1 capsule (40 mg total) by mouth daily.  Dispense: 90 capsule; Refill: 0    Patient was given the opportunity to ask questions.  Patient verbalized understanding of the plan and was able to repeat key elements of the plan. Patient was given clear instructions to go to Emergency Department or return to medical center if symptoms don't improve, worsen, or new problems develop.The patient verbalized understanding.  Orders Placed This Encounter  Procedures   Ambulatory referral to Ophthalmology   Ambulatory referral to Podiatry   POCT glycosylated hemoglobin (Hb A1C)     Requested Prescriptions   Signed Prescriptions Disp Refills   triamterene -hydrochlorothiazide  (MAXZIDE ) 75-50 MG tablet 90 tablet 0    Sig: Take 1 tablet by mouth daily.   losartan  (COZAAR ) 100 MG tablet 90 tablet 0    Sig: Take 1 tablet (100 mg total) by mouth daily.   atorvastatin  (LIPITOR) 40 MG tablet 90 tablet 0    Sig: Take 1 tablet (40 mg total) by mouth every morning.   insulin  aspart (NOVOLOG ) 100 UNIT/ML injection 20 mL 1    Sig: Inject 8 Units into the skin 3 (three) times daily before meals.   Insulin  Glargine (BASAGLAR  KWIKPEN) 100 UNIT/ML 15 mL 1    Sig: Inject 30 Units into the skin daily.   Insulin  Syringe-Needle U-100 31G X 5/16" 0.5 ML MISC 100 each 4    Sig: USE TO INJECT Lantus  20 units daily and Humalog  8 units 3 TIMES DAILY with meals   sertraline  (ZOLOFT ) 100 MG tablet 90 tablet 0    Sig: Take 1 tablet (100 mg total) by mouth daily for 90 doses.   omeprazole  (PRILOSEC) 40 MG capsule 90 capsule 0    Sig: Take 1 capsule (40 mg total) by mouth daily.    Return in about 4 weeks (around 04/22/2024) for Follow-Up or next available chronic conditions.  Senaida Dama, NP

## 2024-03-25 NOTE — Progress Notes (Signed)
 4 month follow up no concerns

## 2024-03-26 ENCOUNTER — Ambulatory Visit: Payer: Self-pay | Admitting: Family

## 2024-03-26 DIAGNOSIS — E1065 Type 1 diabetes mellitus with hyperglycemia: Secondary | ICD-10-CM

## 2024-03-26 LAB — POCT GLYCOSYLATED HEMOGLOBIN (HGB A1C): Hemoglobin A1C: 8.5 % — AB (ref 4.0–5.6)

## 2024-03-26 MED ORDER — BASAGLAR KWIKPEN 100 UNIT/ML ~~LOC~~ SOPN: 35.0000 [IU] | PEN_INJECTOR | Freq: Every day | SUBCUTANEOUS | 2 refills | Status: AC

## 2024-03-27 NOTE — Telephone Encounter (Signed)
 Copied from CRM [id number]. Topic: General - Other >> Mar 25, 2024  3:38 PM Terence Fend E wrote: Reason for CRM: patient calling stating the after visit summary paper has her weight incorrect  paper has weight as 187lbs   patients stating the scale at the office read  157lbs

## 2024-06-05 ENCOUNTER — Other Ambulatory Visit: Payer: Self-pay

## 2024-06-05 ENCOUNTER — Other Ambulatory Visit: Payer: Self-pay | Admitting: Family Medicine

## 2024-06-05 DIAGNOSIS — E1010 Type 1 diabetes mellitus with ketoacidosis without coma: Secondary | ICD-10-CM

## 2024-06-05 MED ORDER — FREESTYLE LIBRE 3 READER DEVI
0 refills | Status: AC
  Filled 2024-06-05 (×2): qty 1, 30d supply, fill #0

## 2024-06-06 ENCOUNTER — Other Ambulatory Visit: Payer: Self-pay

## 2024-06-11 ENCOUNTER — Other Ambulatory Visit: Payer: Self-pay

## 2024-06-12 ENCOUNTER — Other Ambulatory Visit: Payer: Self-pay

## 2024-06-13 ENCOUNTER — Encounter: Payer: Self-pay | Admitting: Emergency Medicine

## 2024-06-13 ENCOUNTER — Ambulatory Visit
Admission: EM | Admit: 2024-06-13 | Discharge: 2024-06-13 | Disposition: A | Discharge status: Home / Self Care | Payer: Self-pay

## 2024-06-13 DIAGNOSIS — Z794 Long term (current) use of insulin: Secondary | ICD-10-CM | POA: Insufficient documentation

## 2024-06-13 DIAGNOSIS — E669 Obesity, unspecified: Secondary | ICD-10-CM | POA: Insufficient documentation

## 2024-06-13 DIAGNOSIS — E785 Hyperlipidemia, unspecified: Secondary | ICD-10-CM | POA: Insufficient documentation

## 2024-06-13 DIAGNOSIS — J309 Allergic rhinitis, unspecified: Secondary | ICD-10-CM | POA: Insufficient documentation

## 2024-06-13 DIAGNOSIS — J069 Acute upper respiratory infection, unspecified: Secondary | ICD-10-CM

## 2024-06-13 DIAGNOSIS — F411 Generalized anxiety disorder: Secondary | ICD-10-CM | POA: Insufficient documentation

## 2024-06-13 DIAGNOSIS — E1065 Type 1 diabetes mellitus with hyperglycemia: Secondary | ICD-10-CM | POA: Insufficient documentation

## 2024-06-13 LAB — POC COVID19/FLU A&B COMBO
Covid Antigen, POC: NEGATIVE
Influenza A Antigen, POC: NEGATIVE
Influenza B Antigen, POC: NEGATIVE

## 2024-06-13 MED ORDER — ONDANSETRON 4 MG PO TBDP
4.0000 mg | ORAL_TABLET | Freq: Three times a day (TID) | ORAL | 0 refills | Status: AC | PRN
Start: 2024-06-13 — End: ?

## 2024-06-13 NOTE — ED Triage Notes (Signed)
 Pt reports productive cough and congestion that started yesterday. Denies sore throat, fevers, and chills. 1 emesis episode this morning that was caused by persistent coughing. Pt has been visiting her mother in rehabilitation center and may have picked something up there. No med use for symptoms.

## 2024-06-13 NOTE — ED Provider Notes (Signed)
 [name]    CSN: [id number] Arrival date & time: 06/13/24  1029      History   Chief Complaint Chief Complaint  Patient presents with   Cough   Nasal Congestion    HPI Carrie Mcpherson is a 63 y.o. female.   Patient here today for evaluation of cough, congestion that started yesterday.  She denies any sore throat, fever or chills.  She did have 1 episode of vomiting this morning but has had some nausea with cough.  She states she has been visiting her mother in a rehabilitation center is not sure if she picked up something there.  She has not taken any medication for symptoms.  The history is provided by the patient.  Cough Associated symptoms: no chills, no ear pain, no eye discharge, no fever, no shortness of breath, no sore throat and no wheezing     Past Medical History:  Diagnosis Date   Blood clot in vein 1994    superficial after right ankle fracture   Depression    Diabetes mellitus without complication (HCC)    GERD (gastroesophageal reflux disease)    occasional   Hypertension    PONV (postoperative nausea and vomiting)    vomiting after one surgery x 1   Sciatic leg pain    right leg   Sleep apnea sept 2014   borderline, no cpap needed    Patient Active Problem List   Diagnosis Date Noted   Allergic rhinitis 06/13/2024   Dyslipidemia 06/13/2024   Generalized anxiety disorder 06/13/2024   Hyperglycemia due to type 1 diabetes mellitus (HCC) 06/13/2024   Long term current use of insulin  (HCC) 06/13/2024   Obesity 06/13/2024   CKD (chronic kidney disease) 07/20/2021   DKA, type 1 (HCC) 07/15/2021   Type 1 diabetes mellitus with ketoacidosis without coma (HCC) 10/26/2019   DKA (diabetic ketoacidosis) (HCC) 10/07/2015   Hypertension 10/07/2015   Gastroesophageal reflux disease 10/07/2015   Depression 10/07/2015   AKI (acute kidney injury) (HCC) 10/07/2015   Acute pharyngitis 10/07/2015   Tobacco use disorder 10/07/2015    Past  Surgical History:  Procedure Laterality Date   COLONOSCOPY WITH PROPOFOL  N/A 08/12/2014   Procedure: COLONOSCOPY WITH PROPOFOL ;  Surgeon: Gladis MARLA Louder, MD;  Location: [facility];  Service: Endoscopy;  Laterality: N/A;   ROTATOR CUFF REPAIR Bilateral right 2004, left 1999    OB History   No obstetric history on file.      Home Medications    Prior to Admission medications   Medication Sig Start Date End Date Taking? Authorizing Provider  HUMALOG  100 UNIT/ML injection  10/03/23  Yes [provider]  insulin  aspart (NOVOLOG ) 100 UNIT/ML injection Inject 8 Units into the skin 3 (three) times daily before meals. 03/25/24  Yes Lorren, Amy J, NP  Insulin  Glargine (BASAGLAR  KWIKPEN) 100 UNIT/ML Inject 35 Units into the skin daily. 03/26/24  Yes Lorren Greig PARAS, NP  Insulin  Syringe-Needle U-100 31G X 5/16 0.5 ML MISC USE TO INJECT Lantus  20 units daily and Humalog  8 units 3 TIMES DAILY with meals 03/25/24  Yes Lorren, Amy J, NP  ondansetron  (ZOFRAN -ODT) 4 MG disintegrating tablet Take 1 tablet (4 mg total) by mouth every 8 (eight) hours as needed. 06/13/24  Yes Billy Asberry FALCON, PA-C  aspirin  EC 81 MG tablet Take 81 mg by mouth daily.    [provider]  atorvastatin  (LIPITOR) 40 MG tablet Take 1 tablet (40 mg total) by mouth every morning.  03/25/24 06/23/24  Lorren Greig PARAS, NP  Continuous Glucose Receiver (FREESTYLE LIBRE 3 READER) DEVI Use to check blood sugar continuously throughout the day. Change sensor every 15 days. 06/05/24   Newlin, Enobong, MD  Continuous Glucose Sensor (FREESTYLE LIBRE 3 PLUS SENSOR) MISC Use to check blood sugar continuously throughout the day. Change sensor every 15 days. 10/02/23   Newlin, Enobong, MD  doxycycline  (VIBRA -TABS) 100 MG tablet 1 tablet Orally twice a day; Duration: 7 days Patient not taking: Reported on 06/13/2024 11/16/20   [provider]  famotidine  (PEPCID ) 20 MG tablet 1 tablet at bedtime as needed Orally Once a day at  night; Duration: 30 days 11/13/19   [provider]  fluticasone (FLONASE) 50 MCG/ACT nasal spray 2 spray in each nostril Nasally Once a day; Duration: 30 day(s) 01/04/18   [provider]  loratadine (CLARITIN) 10 MG tablet 1 tablet Orally Once a day 02/04/14   [provider]  losartan  (COZAAR ) 100 MG tablet Take 1 tablet (100 mg total) by mouth daily. 03/25/24 06/23/24  Lorren Greig PARAS, NP  Multiple Vitamins-Minerals (MULTIVITAMIN WITH MINERALS) tablet Take 1 tablet by mouth 3 (three) times a week.     [provider]  mupirocin  ointment (BACTROBAN ) 2 % 1 Application. 03/29/21   [provider]  omeprazole  (PRILOSEC) 40 MG capsule Take 1 capsule (40 mg total) by mouth daily. 03/25/24 06/23/24  Lorren Greig PARAS, NP  sertraline  (ZOLOFT ) 100 MG tablet Take 1 tablet (100 mg total) by mouth daily for 90 doses. 03/25/24 06/23/24  Lorren Greig PARAS, NP  terbinafine (LAMISIL AT) 1 % cream 1 application to affected area Externally Twice a day; Duration: 30 days 01/17/18   [provider]  traMADol  (ULTRAM ) 50 MG tablet Take 1 tablet (50 mg total) by mouth every 6 (six) hours as needed. Patient not taking: Reported on 06/13/2024 12/03/23 12/02/24  Sofia, Leslie K, PA-C  traMADol  (ULTRAM ) 50 MG tablet Take 1 tablet (50 mg total) by mouth every 6 (six) hours as needed. Patient not taking: Reported on 12/26/2023 12/19/23   Harden Jerona GAILS, MD  triamterene -hydrochlorothiazide  (MAXZIDE ) 75-50 MG tablet Take 1 tablet by mouth daily. 03/25/24 06/23/24  Lorren Greig PARAS, NP  vitamin B-12 (CYANOCOBALAMIN) 500 MCG tablet Take 500 mcg by mouth daily.    [provider]  Vitamin D , Ergocalciferol , (DRISDOL ) 1.25 MG (50000 UNIT) CAPS capsule Take 1 capsule by mouth once a week Patient not taking: Reported on 12/26/2023 05/16/23   Lorren Greig PARAS, NP    Family History Family History  Problem Relation Age of Onset   Breast cancer Maternal Aunt    Hypertension Other     Social  History Social History   Tobacco Use   Smoking status: Every Day    Current packs/day: 0.25    Average packs/day: 0.3 packs/day for 30.0 years (7.5 ttl pk-yrs)    Types: Cigarettes    Passive exposure: Current   Smokeless tobacco: Never  Vaping Use   Vaping status: Never Used  Substance Use Topics   Alcohol use: Yes    Comment: occasional   Drug use: No     Allergies   Sulfamethoxazole -trimethoprim    Review of Systems Review of Systems  Constitutional:  Negative for chills and fever.  HENT:  Positive for congestion. Negative for ear pain and sore throat.   Eyes:  Negative for discharge and redness.  Respiratory:  Positive for cough. Negative for shortness of breath and wheezing.   Gastrointestinal:  Positive for nausea. Negative for abdominal pain, diarrhea and vomiting.     Physical Exam Triage Vital Signs ED Triage Vitals  Encounter Vitals Group     BP 06/13/24 1101 (!) 146/75     Girls Systolic BP Percentile --      Girls Diastolic BP Percentile --      Boys Systolic BP Percentile --      Boys Diastolic BP Percentile --      Pulse Rate 06/13/24 1101 73     Resp 06/13/24 1101 14     Temp 06/13/24 1101 98.4 F (36.9 C)     Temp Source 06/13/24 1101 Oral     SpO2 06/13/24 1101 95 %     Weight --      Height --      Head Circumference --      Peak Flow --      Pain Score 06/13/24 1102 0     Pain Loc --      Pain Education --      Exclude from Growth Chart --    No data found.  Updated Vital Signs BP (!) 146/75 (BP Location: Left Arm)   Pulse 73   Temp 98.4 F (36.9 C) (Oral)   Resp 14   SpO2 95%   Visual Acuity Right Eye Distance:   Left Eye Distance:   Bilateral Distance:    Right Eye Near:   Left Eye Near:    Bilateral Near:     Physical Exam Vitals and nursing note reviewed.  Constitutional:      General: She is not in acute distress.    Appearance: Normal appearance. She is not ill-appearing.  HENT:     Head: Normocephalic and  atraumatic.     Nose: Congestion present.     Mouth/Throat:     Mouth: Mucous membranes are moist.     Pharynx: No oropharyngeal exudate or posterior oropharyngeal erythema.  Eyes:     Conjunctiva/sclera: Conjunctivae normal.  Cardiovascular:     Rate and Rhythm: Normal rate and regular rhythm.     Heart sounds: Normal heart sounds. No murmur heard. Pulmonary:     Effort: Pulmonary effort is normal. No respiratory distress.     Breath sounds: Normal breath sounds. No wheezing, rhonchi or rales.  Skin:    General: Skin is warm and dry.  Neurological:     Mental Status: She is alert.  Psychiatric:        Mood and Affect: Mood normal.        Thought Content: Thought content normal.      UC Treatments / Results  Labs (all labs ordered are listed, but only abnormal results are displayed) Labs Reviewed  POC COVID19/FLU A&B COMBO - Normal    EKG   Radiology No results found.  Procedures Procedures (including critical care time)  Medications Ordered in UC Medications - No data to display  Initial Impression / Assessment and Plan / UC Course  I have reviewed the triage vital signs and the nursing notes.  Pertinent labs & imaging results that were available during my care of the patient were reviewed by me and considered in my medical decision making (see chart for details).    COVID and flu screening negative.  Upper respiratory infection suspected to be viral and recommended symptomatic treatment.  Zofran  prescribed for treatment of nausea.  Did recommend she repeat COVID test at home tomorrow given early illness.  Encouraged follow-up if no gradual  improvement or with any further concerns.  Final Clinical Impressions(s) / UC Diagnoses   Final diagnoses:  Acute upper respiratory infection   Discharge Instructions   None    ED Prescriptions     Medication Sig Dispense Auth. Provider   ondansetron  (ZOFRAN -ODT) 4 MG disintegrating tablet Take 1 tablet (4 mg total)  by mouth every 8 (eight) hours as needed. 20 tablet Billy Asberry FALCON, PA-C      PDMP not reviewed this encounter.   Billy Asberry FALCON, PA-C 06/13/24 1141

## 2024-06-16 ENCOUNTER — Telehealth: Payer: Self-pay

## 2024-06-16 NOTE — Telephone Encounter (Signed)
 Patient verification complete (name and date of birth).   Patient called in stating she is still not feeling any better despite using over the counter medications and prescribed Zofran  as instructed on last Urgent care visit. The patient states she still has a cough, congestion and nausea. The patient reports she has not had any luck coughing up mucus and is requesting medication to assist with symptoms. Provider on site notified.

## 2024-06-16 NOTE — Telephone Encounter (Signed)
 Patient verification complete (name and date of birth). Patient called an notified that it is recommenced that she be seen again and re-evaluated based on the duration of symptoms. Patient then hung up phone. Patient explained purpose prior to handing up the phone, provider aware.

## 2024-06-17 ENCOUNTER — Other Ambulatory Visit: Payer: Self-pay

## 2024-06-17 ENCOUNTER — Telehealth: Payer: Self-pay | Admitting: Family

## 2024-06-17 NOTE — Telephone Encounter (Signed)
 SABRA

## 2024-06-18 ENCOUNTER — Encounter: Payer: Self-pay | Admitting: Internal Medicine

## 2024-06-18 ENCOUNTER — Other Ambulatory Visit: Payer: Self-pay

## 2024-06-18 NOTE — Telephone Encounter (Signed)
 Schedule appointment?

## 2024-06-18 NOTE — Progress Notes (Signed)
 Late entry. Received call from on-call/answering service nurse Corean on 06/16/24 around 3 p.m. She stated that pt called in c/o cough since 4 days prior and was seen at [facility] for it. Cough persist and now has SOB but no fever. Pt reportedly sound SOB on the phone and requested abx. Nurse advised that she goes out to be seen but pt got upset and hung up. I advise nurse that I agree with disposition. She said she will call pt back to let her know.

## 2024-06-19 NOTE — Telephone Encounter (Signed)
 Pt scheduled

## 2024-06-25 ENCOUNTER — Encounter: Payer: Self-pay | Admitting: Family

## 2024-06-25 NOTE — Progress Notes (Signed)
 Erroneous encounter-disregard

## 2024-07-24 ENCOUNTER — Telehealth: Payer: Self-pay

## 2024-07-24 NOTE — Telephone Encounter (Signed)
 Death certificate complete. I called [facility] for assistance.

## 2024-07-24 NOTE — Telephone Encounter (Signed)
 Error

## 2024-07-24 NOTE — Telephone Encounter (Signed)
 Copied from CRM #[id number]. Topic: General - Other >> Jul 24, 2024 12:52 PM Fonda T wrote: Reason for CRM: Received call from Arland with [employer]  Wants to inform provider, a death certificate has been entered on [employer] system for completion of cremation for patient.   Can be reached at [phone], if need to discuss further.

## 2024-08-07 ENCOUNTER — Telehealth: Payer: Self-pay | Admitting: Family

## 2024-08-07 ENCOUNTER — Ambulatory Visit: Admitting: Family

## 2024-08-07 NOTE — Telephone Encounter (Signed)
 Called pt to reschedule missed appt; could not reach or leave vm due to full vm box

## 2024-08-16 ENCOUNTER — Other Ambulatory Visit: Payer: Self-pay | Admitting: Family

## 2024-08-16 DIAGNOSIS — E1065 Type 1 diabetes mellitus with hyperglycemia: Secondary | ICD-10-CM

## 2024-08-16 NOTE — Telephone Encounter (Signed)
 Please note patient reported to be deceased several weeks ago.

## 2024-08-26 ENCOUNTER — Encounter: Payer: Self-pay | Admitting: Radiology
